# Patient Record
Sex: Female | Born: 1985 | Marital: Single
Health system: Southern US, Community
[De-identification: ages and names within clinical notes are randomized; demographics above are authoritative.]

---

## 2011-05-28 ENCOUNTER — Emergency Department: Payer: Self-pay | Admitting: Emergency Medicine

## 2011-06-20 ENCOUNTER — Emergency Department: Payer: Self-pay | Admitting: Unknown Physician Specialty

## 2011-08-08 ENCOUNTER — Encounter: Payer: Self-pay | Admitting: Maternal & Fetal Medicine

## 2011-08-08 LAB — TSH: Thyroid Stimulating Horm: 1.54 u[IU]/mL

## 2011-08-18 ENCOUNTER — Encounter: Payer: Self-pay | Admitting: Maternal and Fetal Medicine

## 2011-08-18 LAB — CBC WITH DIFFERENTIAL/PLATELET
Basophil #: 0 10*3/uL (ref 0.0–0.1)
Eosinophil #: 0.2 10*3/uL (ref 0.0–0.7)
HCT: 38.4 % (ref 35.0–47.0)
HGB: 13.1 g/dL (ref 12.0–16.0)
Lymphocyte %: 15.7 %
MCHC: 34 g/dL (ref 32.0–36.0)
MCV: 92 fL (ref 80–100)
Monocyte %: 4.7 %
Neutrophil #: 9.6 10*3/uL — ABNORMAL HIGH (ref 1.4–6.5)
RBC: 4.17 10*6/uL (ref 3.80–5.20)
RDW: 12.6 % (ref 11.5–14.5)

## 2011-08-18 LAB — APTT: Activated PTT: 38.2 secs — ABNORMAL HIGH (ref 23.6–35.9)

## 2011-08-18 LAB — PROTIME-INR: INR: 0.9

## 2011-08-25 ENCOUNTER — Encounter: Payer: Self-pay | Admitting: Obstetrics and Gynecology

## 2011-08-25 DIAGNOSIS — R55 Syncope and collapse: Secondary | ICD-10-CM

## 2011-08-25 LAB — CBC WITH DIFFERENTIAL/PLATELET
Basophil #: 0 10*3/uL (ref 0.0–0.1)
Basophil %: 0.2 %
Eosinophil #: 0.4 10*3/uL (ref 0.0–0.7)
Eosinophil %: 3 %
HGB: 13.4 g/dL (ref 12.0–16.0)
Lymphocyte #: 1.9 10*3/uL (ref 1.0–3.6)
Lymphocyte %: 13.1 %
MCH: 31 pg (ref 26.0–34.0)
MCHC: 34 g/dL (ref 32.0–36.0)
Monocyte #: 0.9 10*3/uL — ABNORMAL HIGH (ref 0.0–0.7)
Neutrophil %: 77.2 %
Platelet: 247 10*3/uL (ref 150–440)
RBC: 4.32 10*6/uL (ref 3.80–5.20)
RDW: 12.5 % (ref 11.5–14.5)

## 2011-08-25 LAB — BASIC METABOLIC PANEL
Anion Gap: 10 (ref 7–16)
BUN: 6 mg/dL — ABNORMAL LOW (ref 7–18)
Chloride: 99 mmol/L (ref 98–107)
Co2: 29 mmol/L (ref 21–32)
Creatinine: 0.6 mg/dL (ref 0.60–1.30)
Osmolality: 272 (ref 275–301)
Potassium: 3.9 mmol/L (ref 3.5–5.1)
Sodium: 138 mmol/L (ref 136–145)

## 2011-08-25 LAB — HEPATIC FUNCTION PANEL A (ARMC)
Albumin: 3.5 g/dL (ref 3.4–5.0)
Bilirubin,Total: 0.2 mg/dL (ref 0.2–1.0)
Total Protein: 7.6 g/dL (ref 6.4–8.2)

## 2011-09-17 ENCOUNTER — Emergency Department: Payer: Self-pay | Admitting: Emergency Medicine

## 2011-09-17 LAB — COMPREHENSIVE METABOLIC PANEL
Anion Gap: 11 (ref 7–16)
BUN: 7 mg/dL (ref 7–18)
Calcium, Total: 8.2 mg/dL — ABNORMAL LOW (ref 8.5–10.1)
Chloride: 101 mmol/L (ref 98–107)
Co2: 24 mmol/L (ref 21–32)
Creatinine: 0.57 mg/dL — ABNORMAL LOW (ref 0.60–1.30)
EGFR (African American): >60
EGFR (Non-African Amer.): >60
Glucose: 99 mg/dL (ref 65–99)
Osmolality: 270 (ref 275–301)
Potassium: 3.3 mmol/L — ABNORMAL LOW (ref 3.5–5.1)
SGPT (ALT): 17 U/L
Sodium: 136 mmol/L (ref 136–145)
Total Protein: 7.2 g/dL (ref 6.4–8.2)

## 2011-09-17 LAB — CBC
MCH: 30.7 pg (ref 26.0–34.0)
Platelet: 235 10*3/uL (ref 150–440)
RBC: 4.07 10*6/uL (ref 3.80–5.20)
RDW: 13 % (ref 11.5–14.5)
WBC: 20.4 10*3/uL — ABNORMAL HIGH (ref 3.6–11.0)

## 2011-10-17 ENCOUNTER — Encounter: Payer: Self-pay | Admitting: Obstetrics and Gynecology

## 2011-10-19 LAB — URINE CULTURE

## 2011-10-20 ENCOUNTER — Observation Stay: Payer: Self-pay | Admitting: Obstetrics and Gynecology

## 2011-10-20 LAB — URINALYSIS, COMPLETE
Bilirubin,UR: NEGATIVE
Blood: NEGATIVE
Ketone: NEGATIVE
Ph: 6 (ref 4.5–8.0)
RBC,UR: 3 /HPF (ref 0–5)
Squamous Epithelial: 15
WBC UR: 5 /HPF (ref 0–5)

## 2011-10-24 ENCOUNTER — Encounter: Payer: Self-pay | Admitting: Maternal & Fetal Medicine

## 2011-10-29 ENCOUNTER — Observation Stay: Payer: Self-pay

## 2011-10-29 LAB — URINALYSIS, COMPLETE
Blood: NEGATIVE
Protein: NEGATIVE
RBC,UR: 1 /HPF (ref 0–5)
Specific Gravity: 1.009 (ref 1.003–1.030)
WBC UR: 2 /HPF (ref 0–5)

## 2011-11-08 ENCOUNTER — Encounter: Payer: Self-pay | Admitting: Pediatrics

## 2011-11-14 ENCOUNTER — Encounter: Payer: Self-pay | Admitting: Obstetrics and Gynecology

## 2011-11-14 LAB — URINALYSIS, COMPLETE
Bilirubin,UR: NEGATIVE
Blood: NEGATIVE
Glucose,UR: NEGATIVE mg/dL (ref 0–75)
Ketone: NEGATIVE
RBC,UR: NONE SEEN /HPF (ref 0–5)
Specific Gravity: 1.004 (ref 1.003–1.030)
Squamous Epithelial: 3

## 2011-11-15 LAB — URINE CULTURE

## 2011-12-07 ENCOUNTER — Ambulatory Visit: Payer: Self-pay | Admitting: Obstetrics and Gynecology

## 2011-12-08 ENCOUNTER — Encounter: Payer: Self-pay | Admitting: Obstetrics and Gynecology

## 2011-12-08 LAB — CBC WITH DIFFERENTIAL/PLATELET
Basophil %: 0.3 %
Eosinophil %: 1.2 %
Lymphocyte #: 2.3 10*3/uL (ref 1.0–3.6)
Lymphocyte %: 11.7 %
MCH: 30.4 pg (ref 26.0–34.0)
MCHC: 32.8 g/dL (ref 32.0–36.0)
Monocyte #: 1.7 x10 3/mm — ABNORMAL HIGH (ref 0.2–0.9)
Monocyte %: 8.3 %
Neutrophil #: 15.6 10*3/uL — ABNORMAL HIGH (ref 1.4–6.5)
Platelet: 324 10*3/uL (ref 150–440)
RBC: 3.77 10*6/uL — ABNORMAL LOW (ref 3.80–5.20)
RDW: 14.6 % — ABNORMAL HIGH (ref 11.5–14.5)

## 2011-12-24 ENCOUNTER — Ambulatory Visit: Payer: Self-pay | Admitting: Obstetrics and Gynecology

## 2011-12-30 ENCOUNTER — Observation Stay: Payer: Self-pay

## 2011-12-30 LAB — URINALYSIS, COMPLETE
Bacteria: NONE SEEN
Bilirubin,UR: NEGATIVE
Bilirubin,UR: NEGATIVE
Blood: NEGATIVE
Glucose,UR: NEGATIVE mg/dL
Ketone: NEGATIVE
Ketone: NEGATIVE
Nitrite: NEGATIVE
Nitrite: NEGATIVE
Ph: 6
Ph: 7 (ref 4.5–8.0)
Protein: 30
Protein: NEGATIVE
RBC,UR: 18 /HPF
RBC,UR: NONE SEEN /HPF (ref 0–5)
Specific Gravity: 1.003
Squamous Epithelial: 20
Squamous Epithelial: <1
WBC UR: 9 /HPF
WBC UR: 1 /HPF (ref 0–5)

## 2011-12-30 LAB — CBC WITH DIFFERENTIAL/PLATELET
Basophil #: 0 10*3/uL
Basophil %: 0.1 %
Eosinophil #: 0.3 10*3/uL
Eosinophil %: 1.2 %
HCT: 34.4 % — ABNORMAL LOW
HGB: 11.4 g/dL — ABNORMAL LOW
Lymphocyte %: 10.7 %
Lymphs Abs: 2.3 10*3/uL
MCH: 30.7 pg
MCHC: 33.1 g/dL
MCV: 93 fL
Monocyte #: 1.6 10*3/uL — ABNORMAL HIGH
Monocyte %: 7.3 %
Neutrophil #: 17.3 10*3/uL — ABNORMAL HIGH
Neutrophil %: 80.7 %
Platelet: 394 10*3/uL
RBC: 3.71 X10 6/mm 3 — ABNORMAL LOW
RDW: 14.1 %
WBC: 21.4 10*3/uL — ABNORMAL HIGH

## 2011-12-30 LAB — COMPREHENSIVE METABOLIC PANEL WITH GFR
Albumin: 2.1 g/dL — ABNORMAL LOW
Alkaline Phosphatase: 148 U/L — ABNORMAL HIGH
Anion Gap: 9
BUN: 2 mg/dL — ABNORMAL LOW
Bilirubin,Total: 0.1 mg/dL — ABNORMAL LOW
Calcium, Total: 7.9 mg/dL — ABNORMAL LOW
Chloride: 109 mmol/L — ABNORMAL HIGH
Co2: 24 mmol/L
Creatinine: 0.55 mg/dL — ABNORMAL LOW
EGFR (African American): >60
EGFR (Non-African Amer.): >60
Glucose: 89 mg/dL
Osmolality: 279
Potassium: 3.4 mmol/L — ABNORMAL LOW
SGOT(AST): 19 U/L
SGPT (ALT): 16 U/L
Sodium: 142 mmol/L
Total Protein: 6.1 g/dL — ABNORMAL LOW

## 2011-12-30 LAB — OCCULT BLOOD X 1 CARD TO LAB, STOOL: Occult Blood, Feces: NEGATIVE

## 2011-12-30 LAB — LITHIUM LEVEL: Lithium: 0.42 mmol/L — ABNORMAL LOW

## 2011-12-31 LAB — HEMATOCRIT: HCT: 33.3 % — ABNORMAL LOW (ref 35.0–47.0)

## 2012-01-23 ENCOUNTER — Observation Stay: Payer: Self-pay

## 2012-01-23 LAB — COMPREHENSIVE METABOLIC PANEL
Albumin: 2.2 g/dL — ABNORMAL LOW (ref 3.4–5.0)
Alkaline Phosphatase: 201 U/L — ABNORMAL HIGH (ref 50–136)
Anion Gap: 14 (ref 7–16)
BUN: 5 mg/dL — ABNORMAL LOW (ref 7–18)
Bilirubin,Total: 0.9 mg/dL (ref 0.2–1.0)
Calcium, Total: 9 mg/dL (ref 8.5–10.1)
Co2: 22 mmol/L (ref 21–32)
EGFR (African American): >60
Glucose: 127 mg/dL — ABNORMAL HIGH (ref 65–99)
Osmolality: 271 (ref 275–301)
SGPT (ALT): 25 U/L
Sodium: 136 mmol/L (ref 136–145)

## 2012-01-23 LAB — URINALYSIS, COMPLETE
Glucose,UR: 50 mg/dL (ref 0–75)
Leukocyte Esterase: NEGATIVE
Nitrite: NEGATIVE
Ph: 5 (ref 4.5–8.0)
Protein: 100
RBC,UR: 3 /HPF (ref 0–5)
Specific Gravity: 1.029 (ref 1.003–1.030)
Squamous Epithelial: 6
Transitional Epi: <1

## 2012-01-23 LAB — CBC WITH DIFFERENTIAL/PLATELET
Basophil #: 0.1 10*3/uL (ref 0.0–0.1)
Eosinophil #: 0.1 10*3/uL (ref 0.0–0.7)
HCT: 37.2 % (ref 35.0–47.0)
Lymphocyte %: 10.8 %
MCHC: 33.3 g/dL (ref 32.0–36.0)
MCV: 92 fL (ref 80–100)
Monocyte #: 1.8 x10 3/mm — ABNORMAL HIGH (ref 0.2–0.9)
Monocyte %: 9.9 %
Neutrophil #: 14.1 10*3/uL — ABNORMAL HIGH (ref 1.4–6.5)
Neutrophil %: 78.2 %
Platelet: 374 10*3/uL (ref 150–440)
RDW: 14.1 % (ref 11.5–14.5)

## 2012-01-30 ENCOUNTER — Encounter: Payer: Self-pay | Admitting: Obstetrics and Gynecology

## 2012-02-01 LAB — BETA STREP CULTURE(ARMC)

## 2012-05-06 ENCOUNTER — Emergency Department: Payer: Self-pay | Admitting: Emergency Medicine

## 2012-05-06 LAB — LITHIUM LEVEL: Lithium: 1.2 mmol/L

## 2012-05-06 LAB — CBC
HCT: 41 % (ref 35.0–47.0)
HGB: 13.7 g/dL (ref 12.0–16.0)
MCHC: 33.5 g/dL (ref 32.0–36.0)
MCV: 89 fL (ref 80–100)
Platelet: 320 10*3/uL (ref 150–440)
RDW: 13.8 % (ref 11.5–14.5)
WBC: 11.4 10*3/uL — ABNORMAL HIGH (ref 3.6–11.0)

## 2012-05-06 LAB — BASIC METABOLIC PANEL
Calcium, Total: 8.9 mg/dL (ref 8.5–10.1)
Co2: 26 mmol/L (ref 21–32)
EGFR (African American): >60
EGFR (Non-African Amer.): >60
Glucose: 94 mg/dL (ref 65–99)
Osmolality: 276 (ref 275–301)
Potassium: 2.9 mmol/L — ABNORMAL LOW (ref 3.5–5.1)

## 2012-05-06 LAB — URINALYSIS, COMPLETE
Bacteria: NONE SEEN
Bilirubin,UR: NEGATIVE
Blood: NEGATIVE
Ketone: NEGATIVE
Nitrite: NEGATIVE
Ph: 6 (ref 4.5–8.0)
Protein: NEGATIVE
WBC UR: 2 /HPF (ref 0–5)

## 2012-05-06 LAB — TSH: Thyroid Stimulating Horm: 1.62 u[IU]/mL

## 2012-05-06 LAB — T4, FREE: Free Thyroxine: 0.89 ng/dL (ref 0.76–1.46)

## 2012-10-14 IMAGING — US US OB FOLLOW-UP - NRPT [facility]
1 series · 14 of 28 positions shown · non-contrast
Comparison: none

[Series 1: us ob follow-up - nrpt [facility] · 0.26mm/px · 14 of 57 slices shown]
[im 3/57]
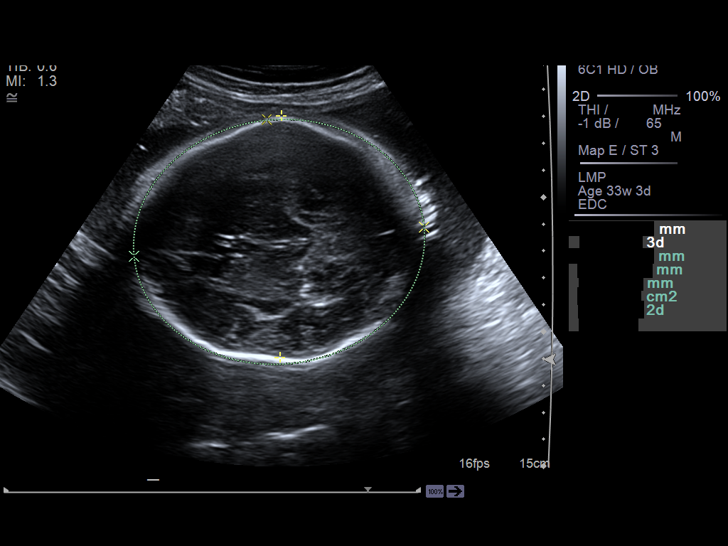
[im 7/57]
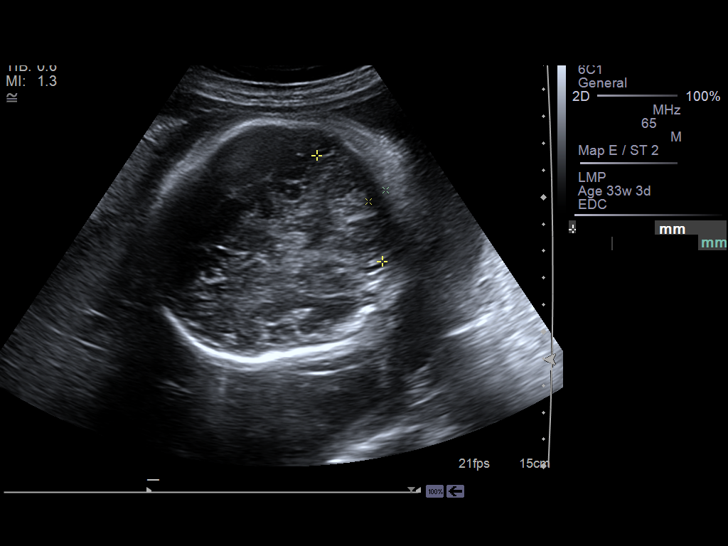
[im 11/57]
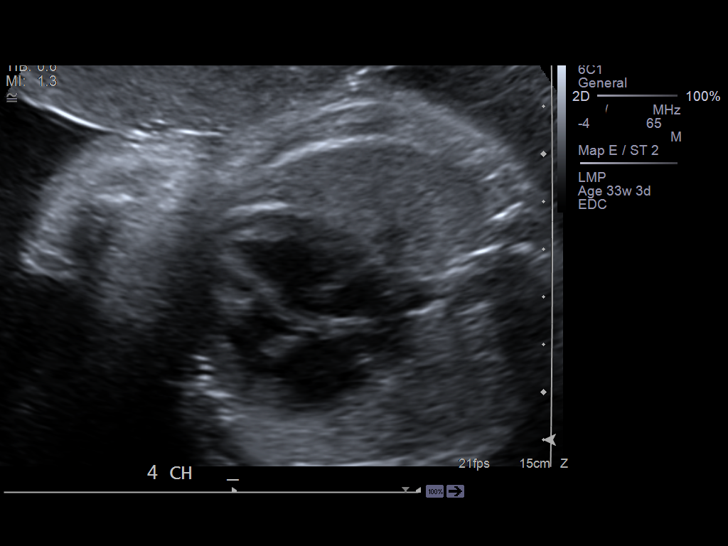
[im 15/57]
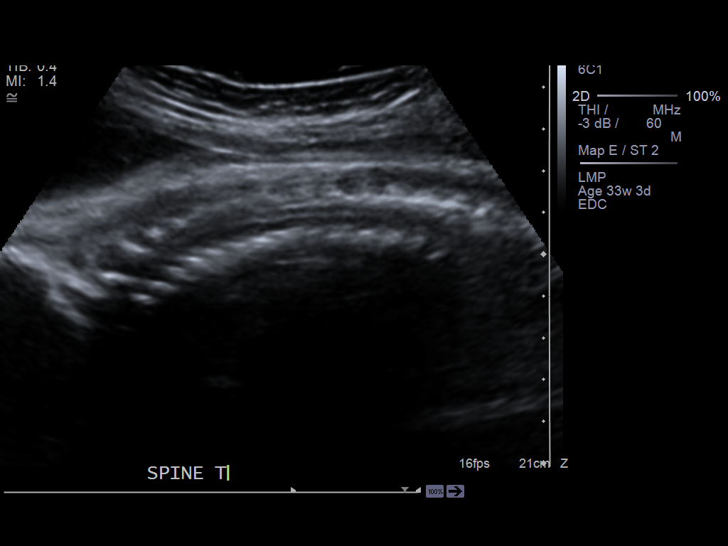
[im 19/57]
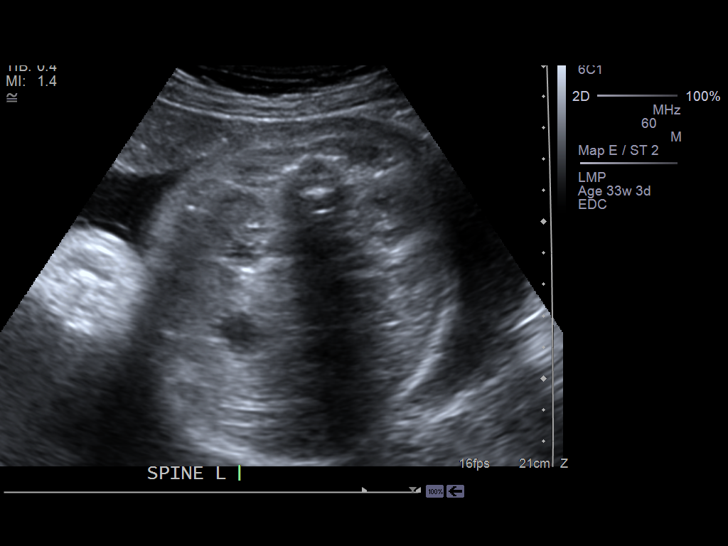
[im 23/57]
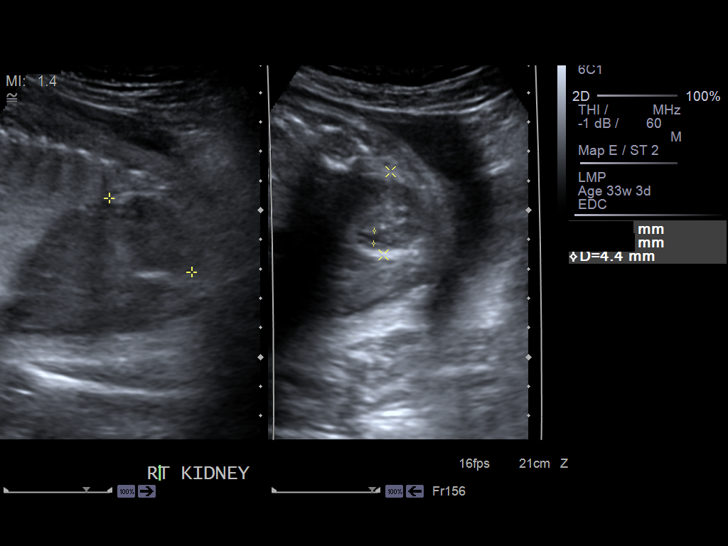
[im 27/57]
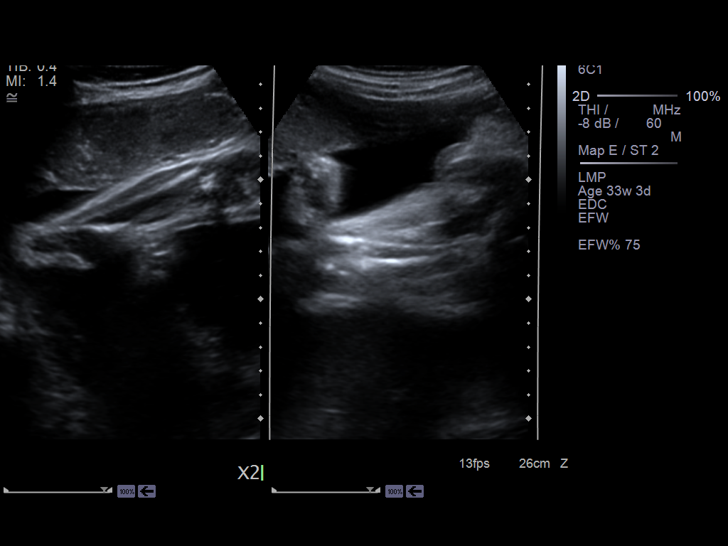
[im 32/57]
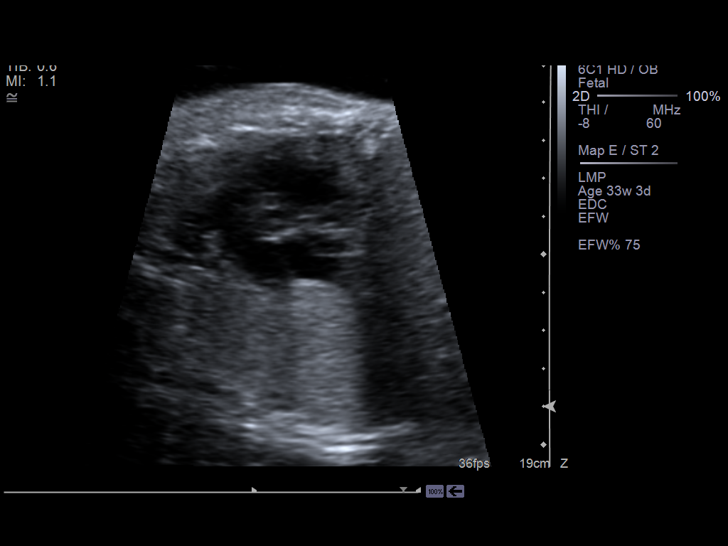
[im 36/57]
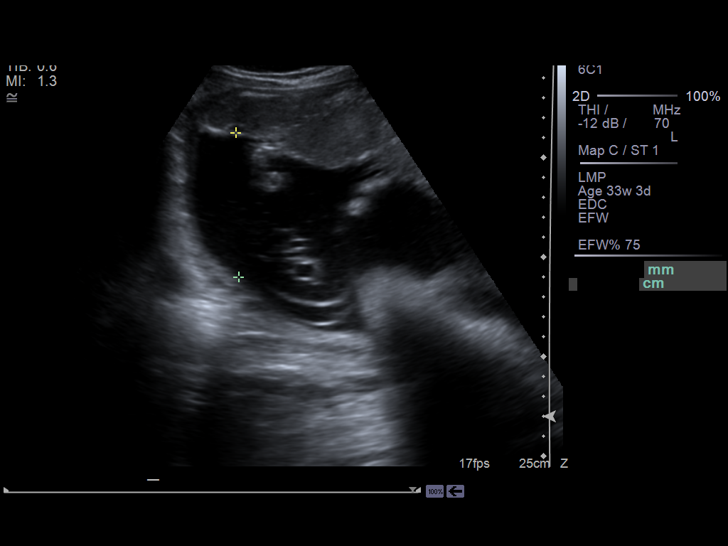
[im 40/57]
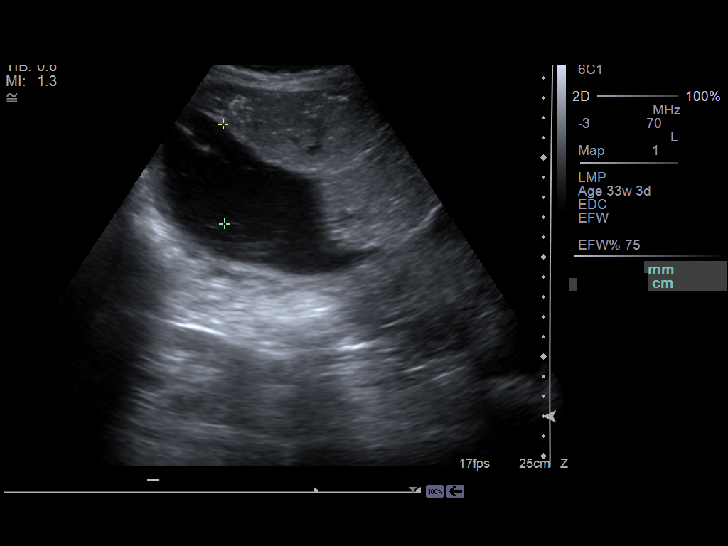
[im 44/57]
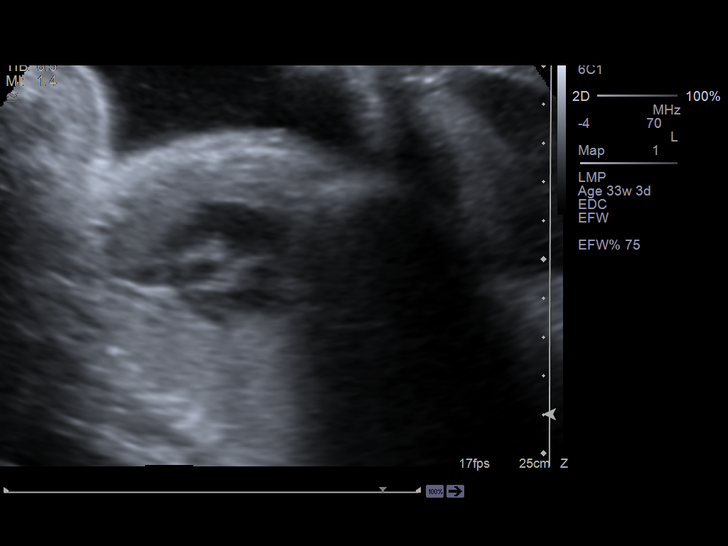
[im 48/57]
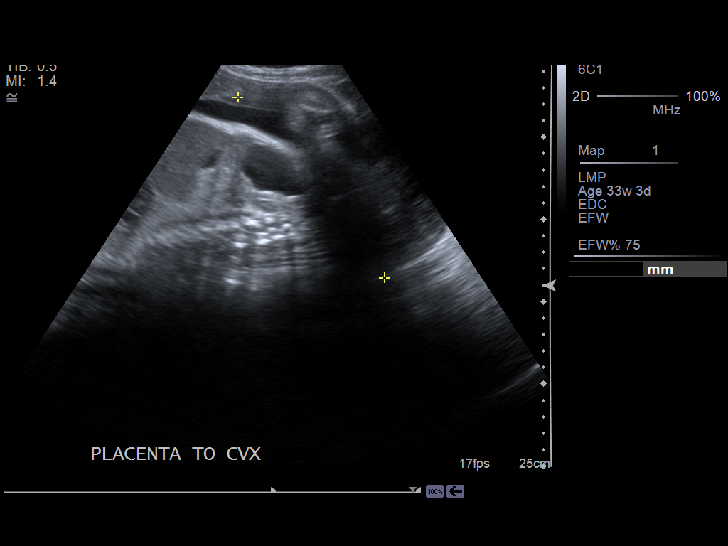
[im 52/57]
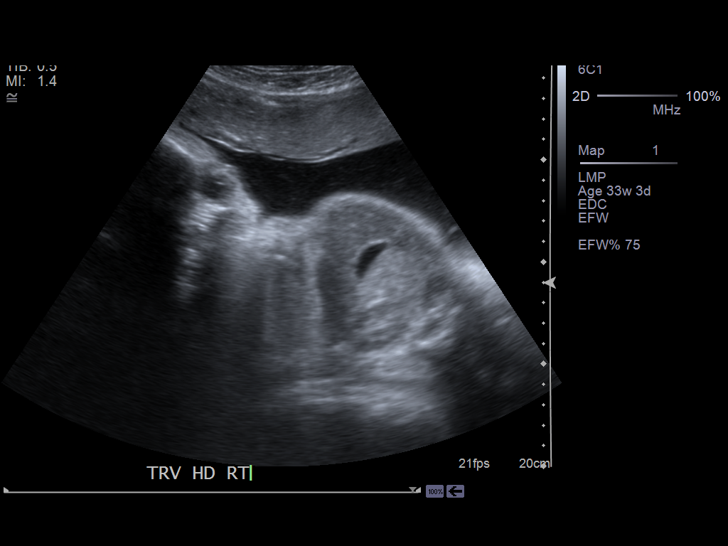
[im 57/57]
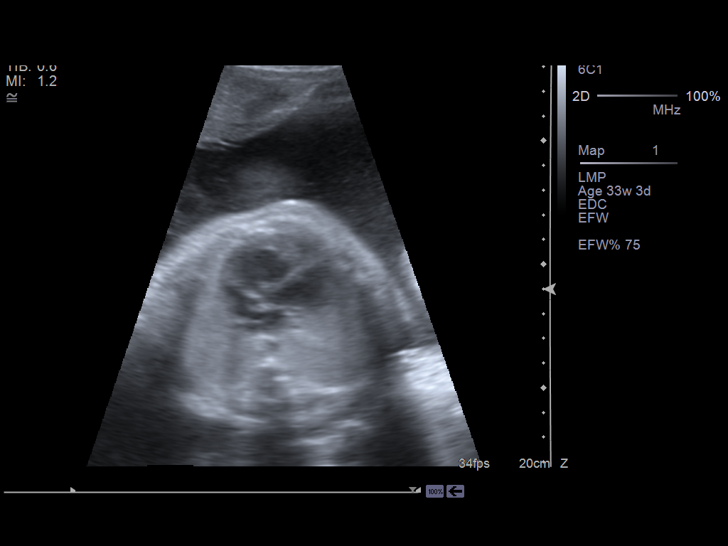

[14 of 28 positions shown; findings below may reference images not displayed]

IMAGES IMPORTED FROM THE SYNGO WORKFLOW SYSTEM
NO DICTATION FOR STUDY

## 2012-11-04 IMAGING — US US OB FOLLOW-UP - NRPT [facility]
1 series · 14 of 28 positions shown · non-contrast
Comparison: none

[Series 1: us ob follow-up - nrpt [facility] · 0.30mm/px · 14 of 49 slices shown]
[im 2/49]
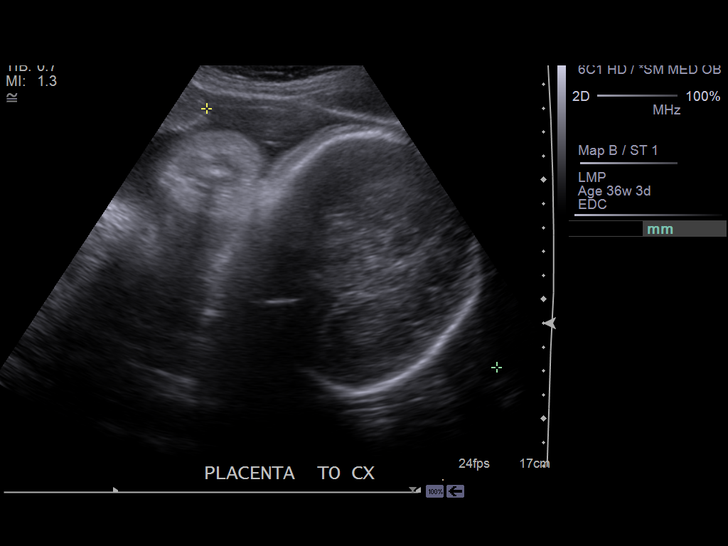
[im 6/49]
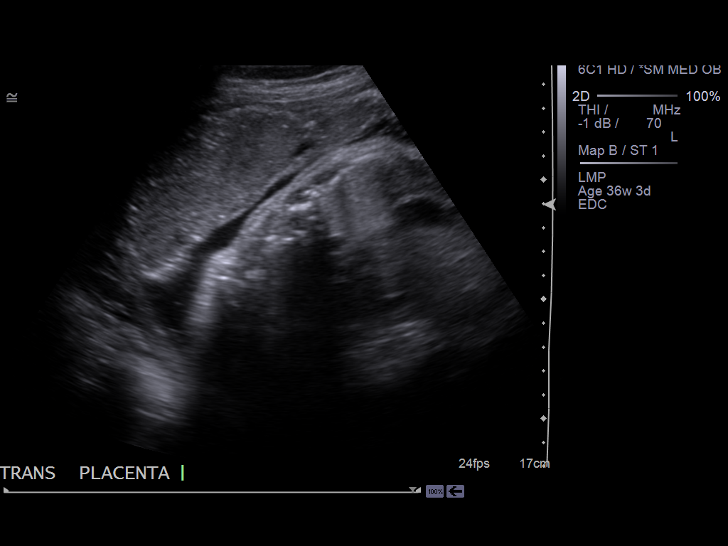
[im 9/49]
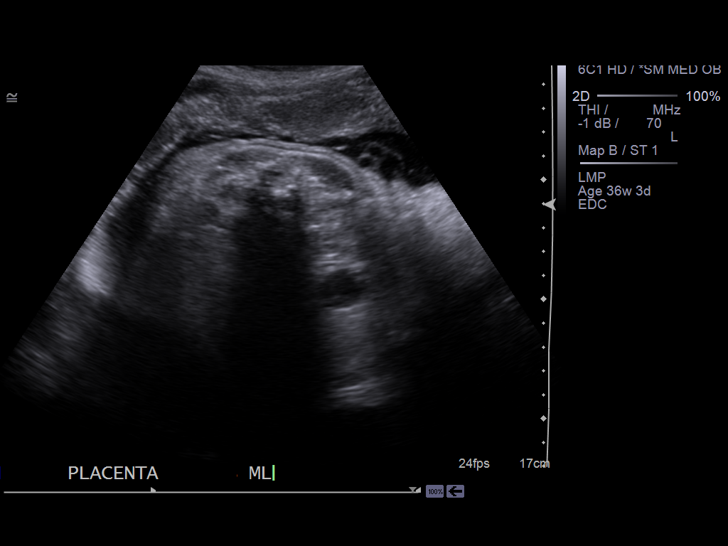
[im 13/49]
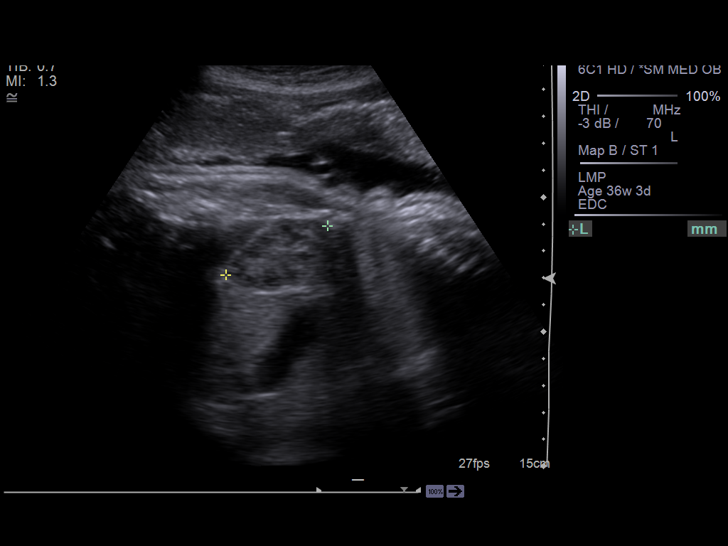
[im 17/49]
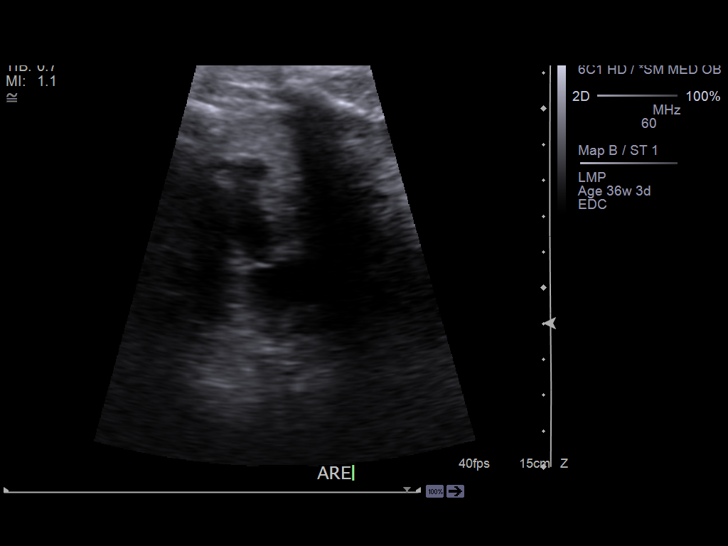
[im 20/49]
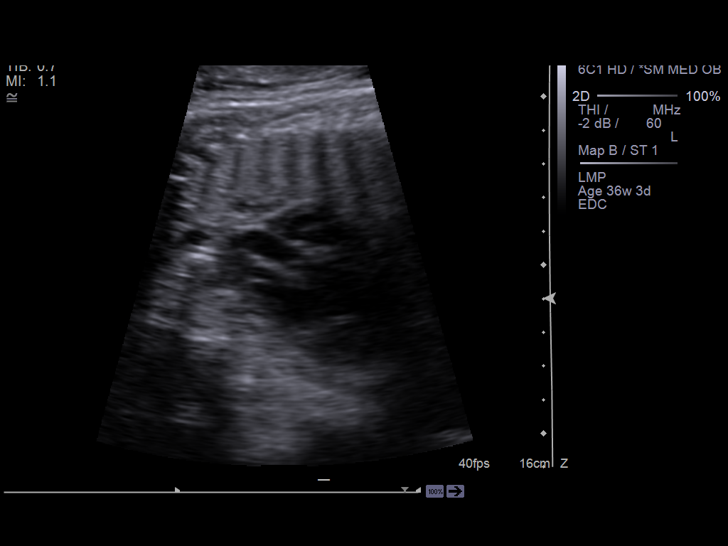
[im 24/49]
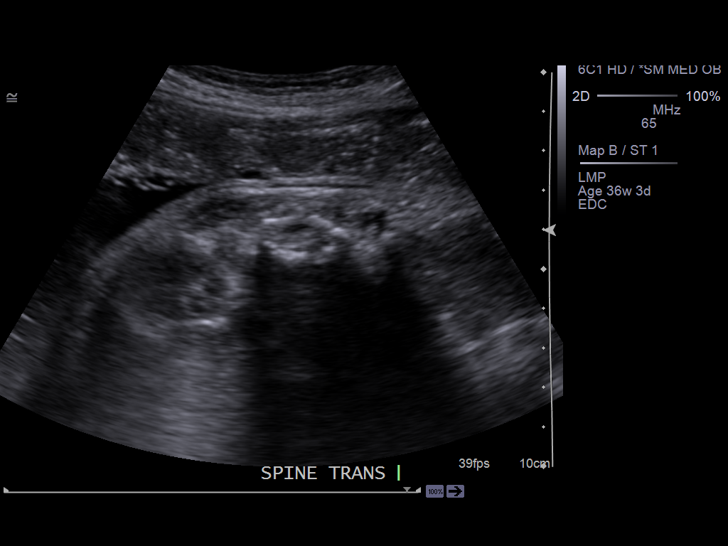
[im 27/49]
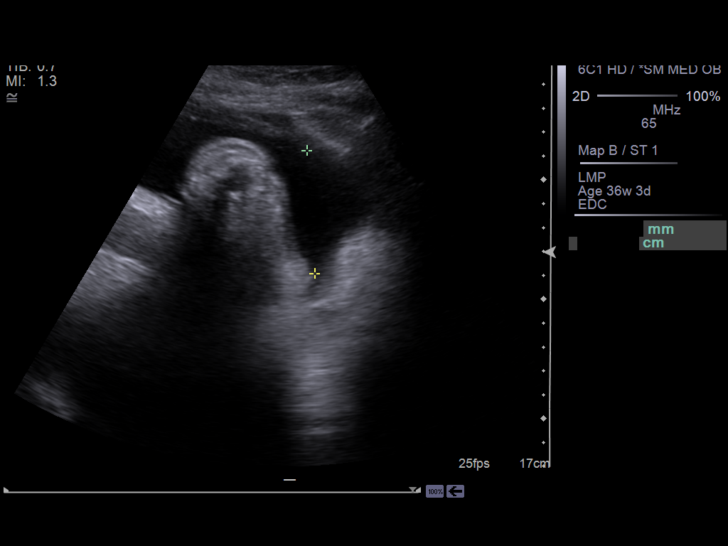
[im 31/49]
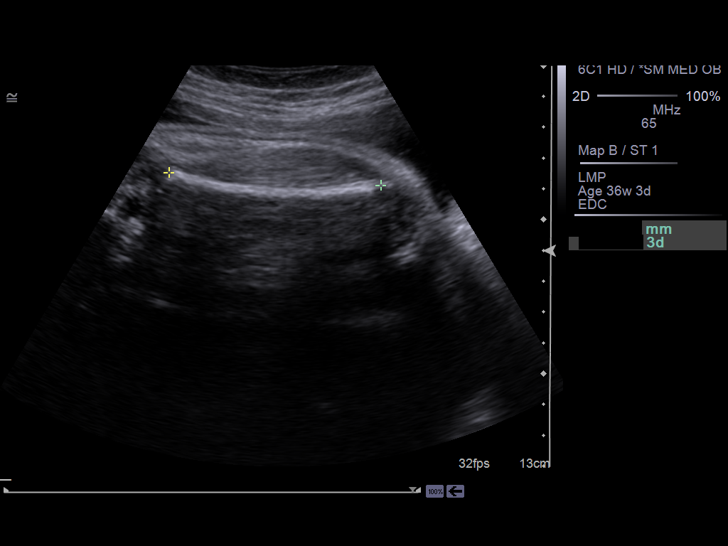
[im 34/49]
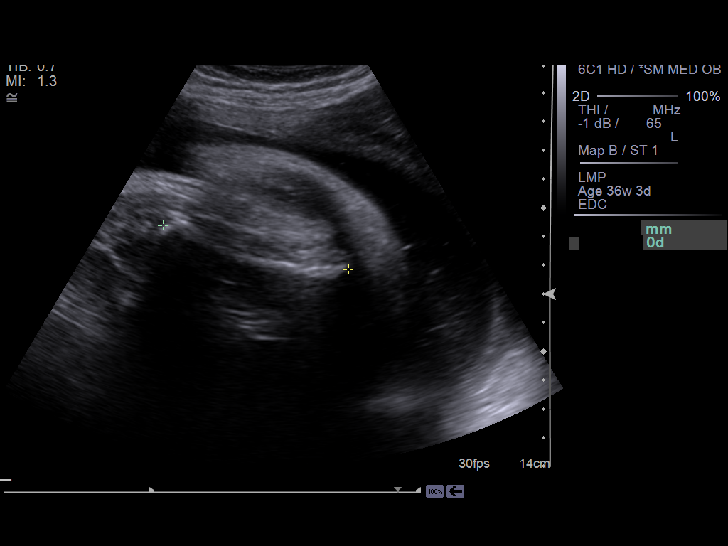
[im 38/49]
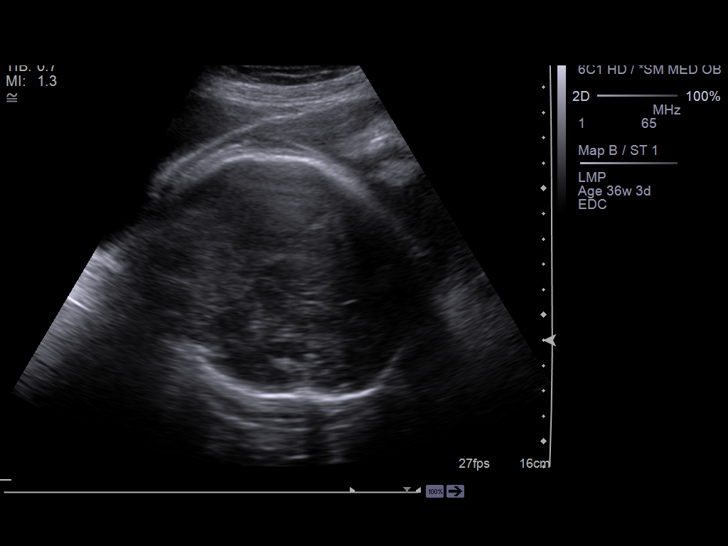
[im 41/49]
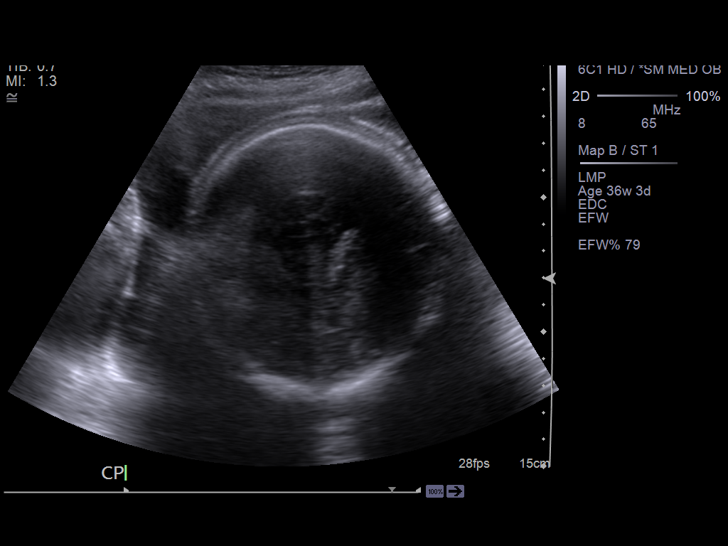
[im 45/49]
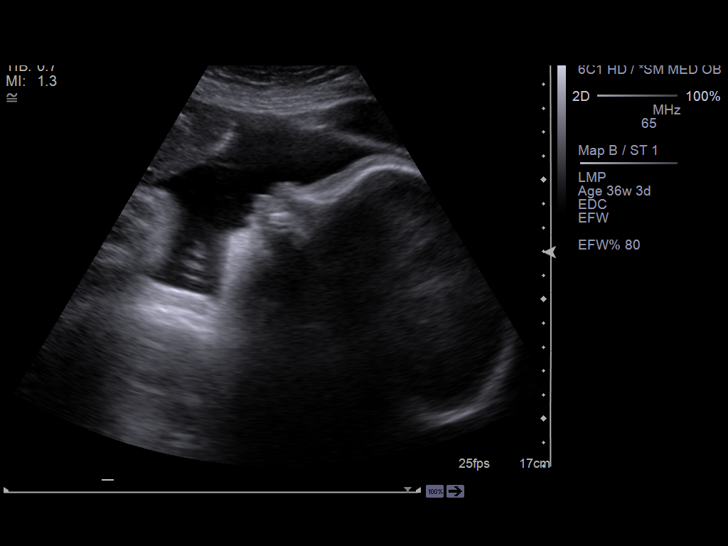
[im 49/49]
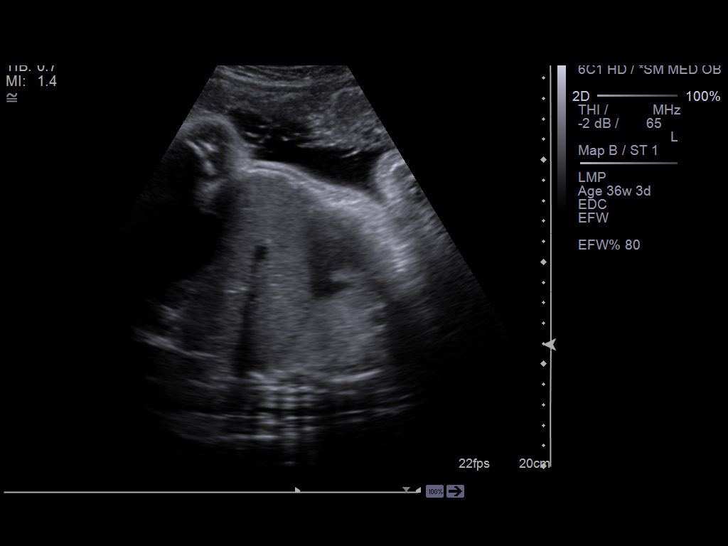

[14 of 28 positions shown; findings below may reference images not displayed]

IMAGES IMPORTED FROM THE SYNGO WORKFLOW SYSTEM
NO DICTATION FOR STUDY

## 2012-11-04 IMAGING — US US EXTREM UP VENOUS*R*
1 series · 14 of 24 positions shown · non-contrast
Comparison: none

REASON FOR EXAM: STAT CR DR Keya [phone] or her pager is [REDACTED] pain and swelling...
COMMENTS:

PROCEDURE:     US  - US DOPPLER UP EXTR RIGHT  - January 30, 2012  [DATE]
RESULT:     The deep venous system of the right upper extremity was
interrogated with Doppler ultrasound. The waveform patterns and color flow
images are normal. The accessible veins were normally compressible.

[Series 1: us extrem up venous*right* · 0.05mm/px · 14 of 32 slices shown]
[im 1/32]
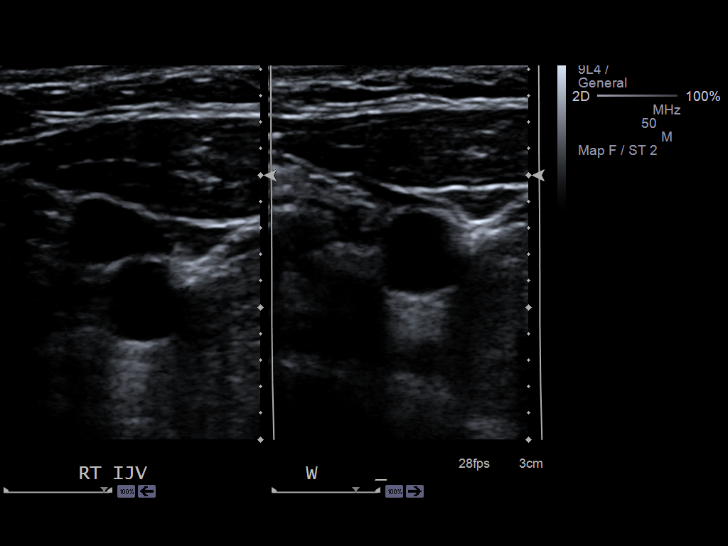
[im 3/32]
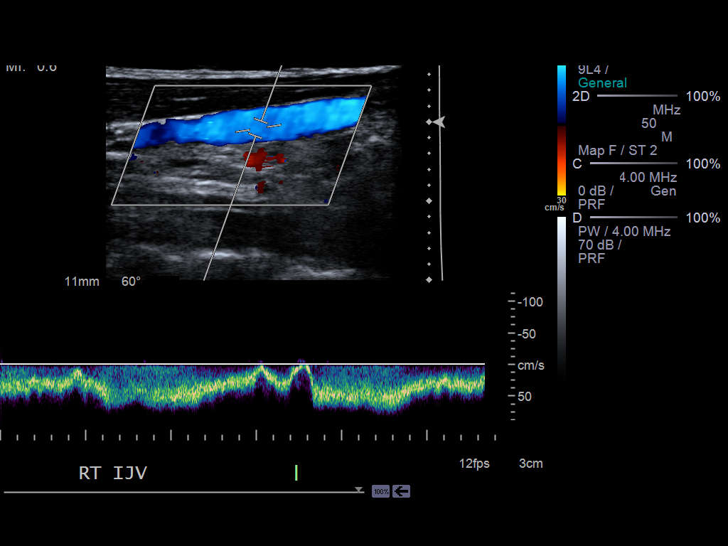
[im 6/32]
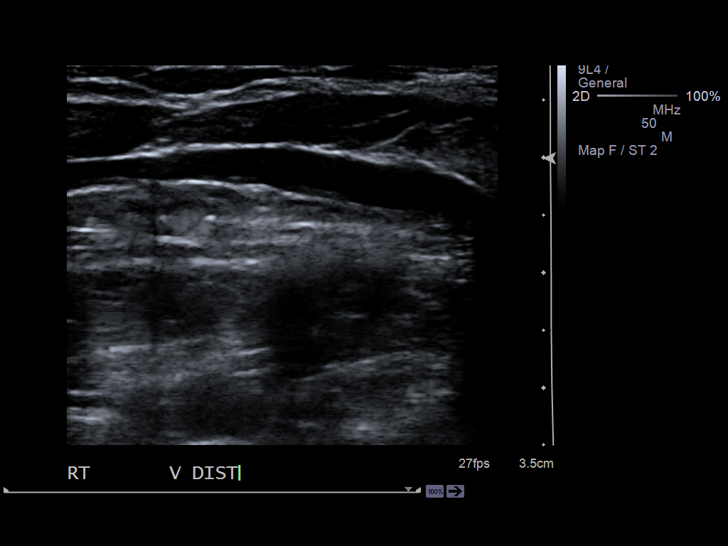
[im 9/32]
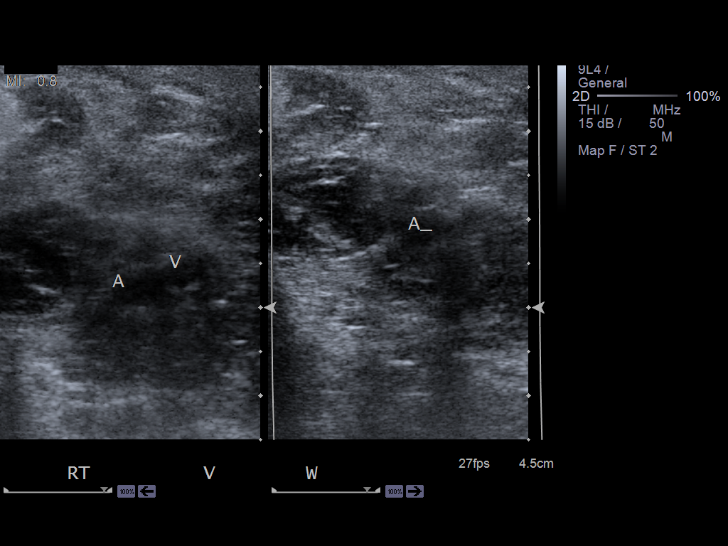
[im 10/32]
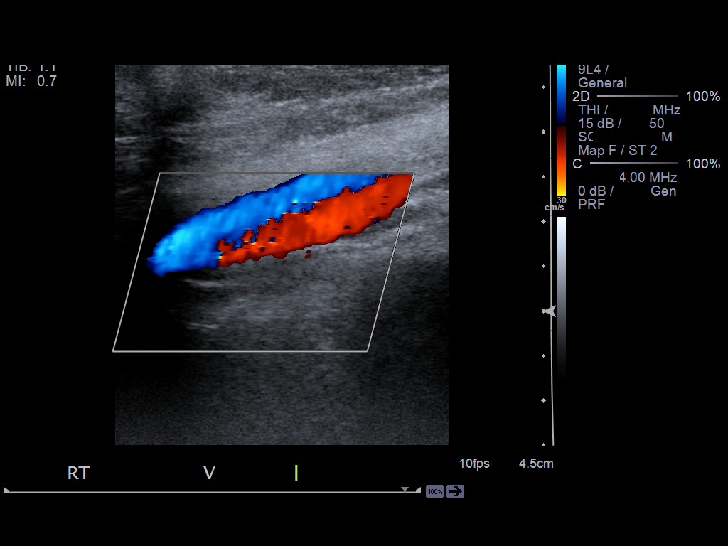
[im 13/32]
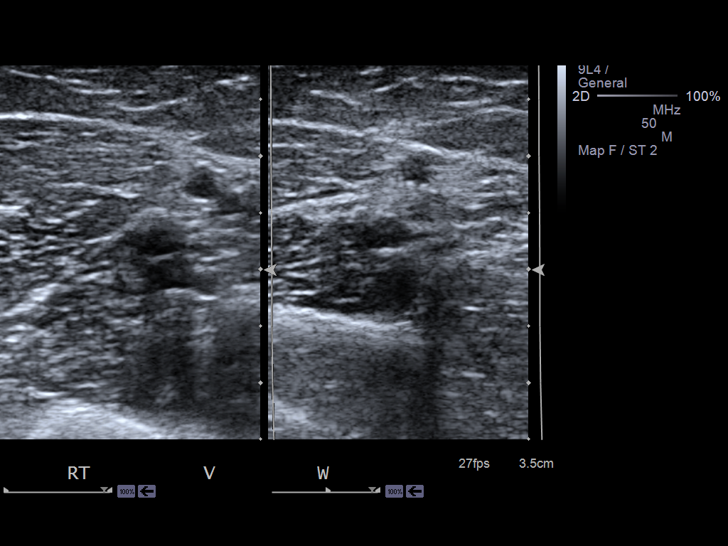
[im 15/32]
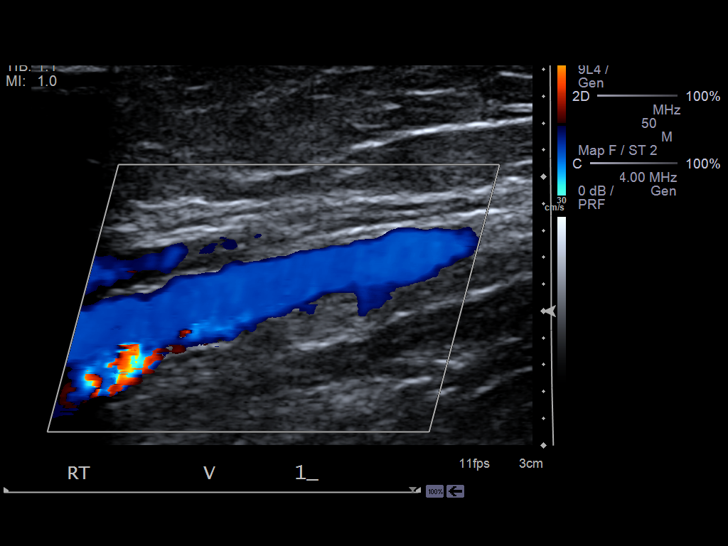
[im 17/32]
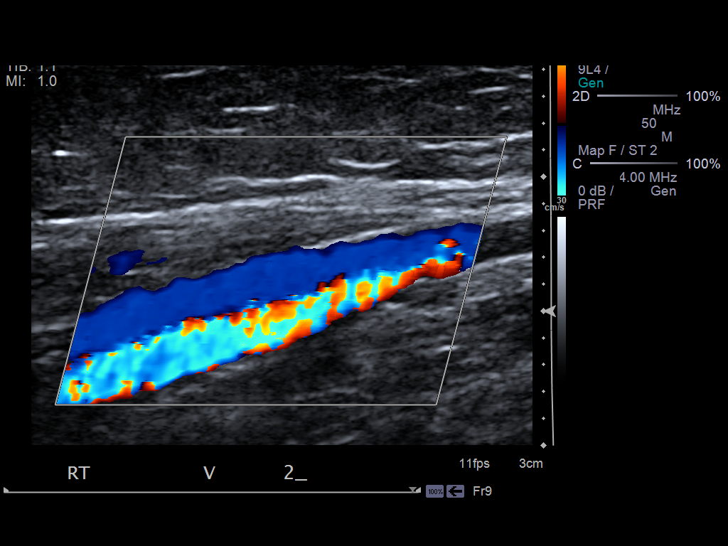
[im 19/32]
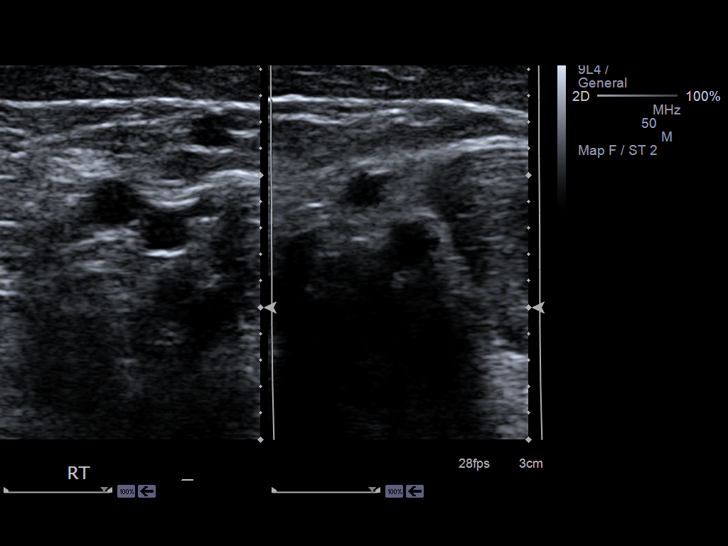
[im 22/32]
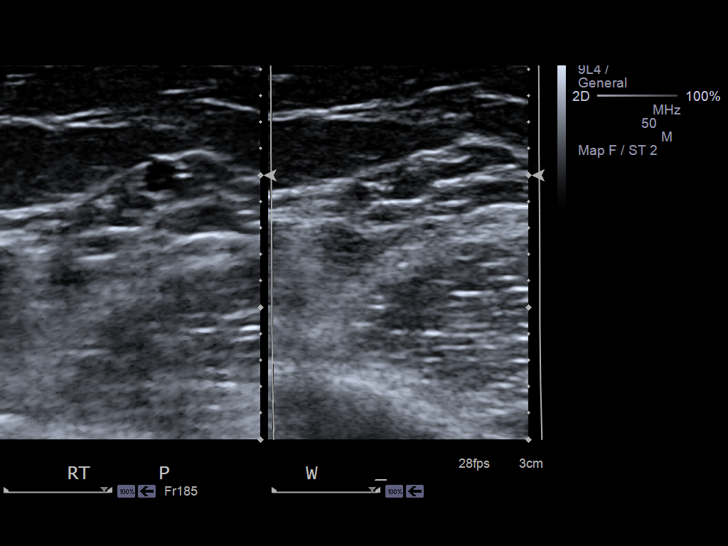
[im 25/32]
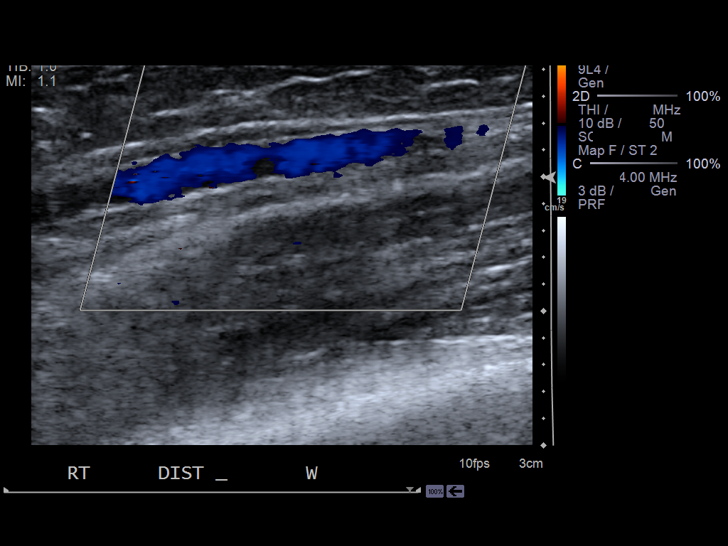
[im 26/32]
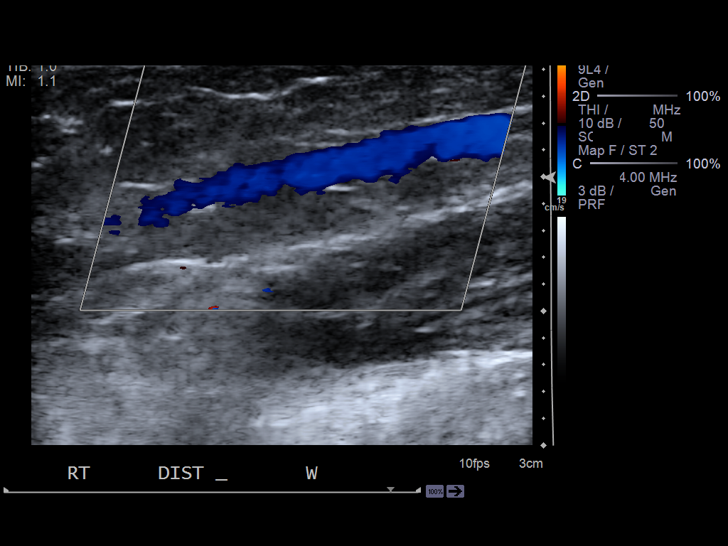
[im 29/32]
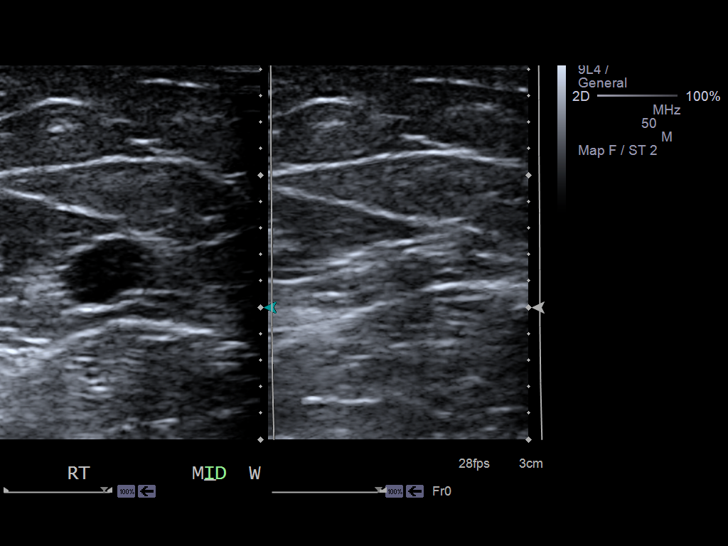
[im 32/32]
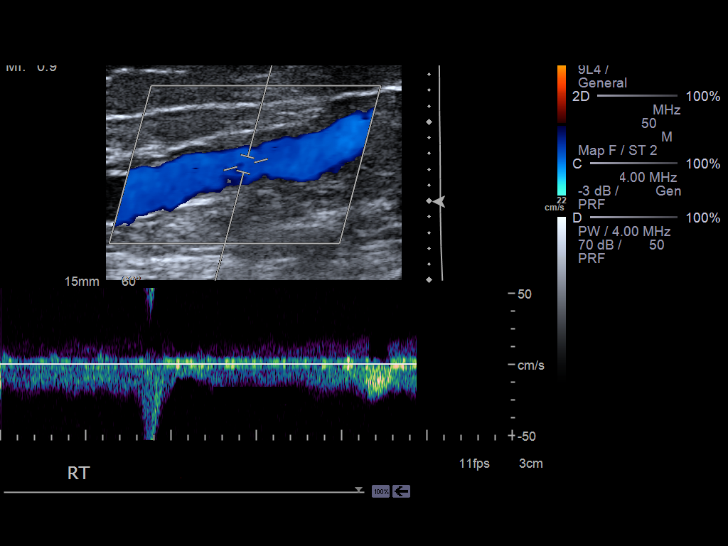

[14 of 24 positions shown; findings below may reference images not displayed]

IMPRESSION: There is no evidence of deep venous thrombosis in the right
upper extremity from the antecubital veins to the jugular vein.

## 2012-12-02 ENCOUNTER — Emergency Department: Payer: Self-pay | Admitting: Emergency Medicine

## 2013-01-13 ENCOUNTER — Emergency Department: Payer: Self-pay | Admitting: Emergency Medicine

## 2013-01-13 LAB — COMPREHENSIVE METABOLIC PANEL
Albumin: 4.2 g/dL (ref 3.4–5.0)
Bilirubin,Total: 0.3 mg/dL (ref 0.2–1.0)
Chloride: 107 mmol/L (ref 98–107)
Co2: 29 mmol/L (ref 21–32)
Creatinine: 0.84 mg/dL (ref 0.60–1.30)
EGFR (African American): >60
Osmolality: 280 (ref 275–301)
Potassium: 3.7 mmol/L (ref 3.5–5.1)
SGOT(AST): 26 U/L (ref 15–37)
SGPT (ALT): 45 U/L (ref 12–78)
Sodium: 142 mmol/L (ref 136–145)
Total Protein: 8.2 g/dL (ref 6.4–8.2)

## 2013-01-13 LAB — CBC
HCT: 42.6 % (ref 35.0–47.0)
HGB: 14.2 g/dL (ref 12.0–16.0)
MCH: 29.1 pg (ref 26.0–34.0)
MCHC: 33.5 g/dL (ref 32.0–36.0)
RBC: 4.89 10*6/uL (ref 3.80–5.20)
RDW: 14.1 % (ref 11.5–14.5)
WBC: 12.7 10*3/uL — ABNORMAL HIGH (ref 3.6–11.0)

## 2013-03-28 ENCOUNTER — Other Ambulatory Visit: Payer: Self-pay | Admitting: Urgent Care

## 2013-03-28 LAB — CBC WITH DIFFERENTIAL/PLATELET
Basophil #: 0.1 10*3/uL (ref 0.0–0.1)
Basophil %: 1.1 %
Eosinophil #: 0.4 10*3/uL (ref 0.0–0.7)
Eosinophil %: 6.2 %
HGB: 14.4 g/dL (ref 12.0–16.0)
Lymphocyte #: 1.4 10*3/uL (ref 1.0–3.6)
MCV: 86 fL (ref 80–100)
Monocyte #: 0.8 x10 3/mm (ref 0.2–0.9)
Monocyte %: 12.8 %
Neutrophil #: 3.7 10*3/uL (ref 1.4–6.5)
Neutrophil %: 57.9 %
RBC: 4.82 10*6/uL (ref 3.80–5.20)
RDW: 13.3 % (ref 11.5–14.5)

## 2013-03-28 LAB — COMPREHENSIVE METABOLIC PANEL
Albumin: 3.5 g/dL (ref 3.4–5.0)
Anion Gap: 2 — ABNORMAL LOW (ref 7–16)
BUN: 4 mg/dL — ABNORMAL LOW (ref 7–18)
Bilirubin,Total: 0.1 mg/dL — ABNORMAL LOW (ref 0.2–1.0)
Calcium, Total: 8.7 mg/dL (ref 8.5–10.1)
Co2: 26 mmol/L (ref 21–32)
EGFR (African American): >60
Glucose: 119 mg/dL — ABNORMAL HIGH (ref 65–99)
SGOT(AST): 24 U/L (ref 15–37)
SGPT (ALT): 27 U/L (ref 12–78)
Total Protein: 7.4 g/dL (ref 6.4–8.2)

## 2013-04-02 ENCOUNTER — Ambulatory Visit: Payer: Self-pay | Admitting: Urgent Care

## 2013-04-25 ENCOUNTER — Ambulatory Visit: Payer: Self-pay | Admitting: Gastroenterology

## 2013-05-19 ENCOUNTER — Emergency Department: Payer: Self-pay | Admitting: Internal Medicine

## 2013-06-13 ENCOUNTER — Encounter: Payer: Self-pay | Admitting: Obstetrics and Gynecology

## 2013-06-14 ENCOUNTER — Encounter: Payer: Self-pay | Admitting: Maternal and Fetal Medicine

## 2014-11-14 NOTE — Consult Note (Signed)
Referral Information:  Reason for Referral 29 yo G4P1021 at approximately [redacted] weeks gestation. Of note during her pregnancy in 2013, the patient had consultations with [place].  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 She had removal of first ribs bilaterally [facility]) and portions of her scalene muscles.  Then thrombolyisis was attempted but unsuccessful due to hardening of placque.  She reports normal thrombophilia testing therapeutic lovenox x 14 months initially  She also reports use of Plavix for approximately 8 months.  SHE reports residual numbness intermittently and occasional loss of sensation in right hand.  With her pregnancy last year, the patient was on prophylactic doses of Enoxaparin (30 mg twice daily until 28 weeks; 40 mg twice daily from 28-36 weeks; UFH 7500 twice daily for the remainder of her pregnancy. During this pregnancy, the patient had a negative thrombophilia work up.   That pregnancy also complicated with GDM   Referring Physician [facility]   Past Obstetrical Hx 2005 pregnancy loss (8weeks) 2012 pregnancy loss (12-13 weeks), D&E--was on lovenox daily during that pregnancy 2013 as described above - Also during the pregnancy, the patient developed gestational diabetes   Home Medications: Medication Instructions Status  lithium 300 mg oral capsule 1 cap(s) orally 3 times a day Active  Neurontin 800 mg oral tablet 1 tab(s) orally 3 times a day Active  clonazePAM 2 mg oral tablet  orally 2 times a day Active   Allergies:   Penicillin: Anaphylaxis  Ceftin: Anaphylaxis  Biaxin: Anaphylaxis  Iodine: Anaphylaxis  Shellfish: Anaphylaxis  Latex: Unknown  Vital Signs/Notes:  Nursing Vital Signs: **Vital Signs.:   17-Nov-14 13:52  Vital Signs Type Routine; Perinatal Clinic  Temperature Temperature (F) 97.3  Celsius 36.2  Pulse Pulse 93  Respirations Respirations 18   Systolic BP Systolic BP 329  Diastolic BP (mmHg) Diastolic BP (mmHg) 66  Mean BP 81  Pulse Ox % Pulse Ox % 100  Oxygen Delivery Room Air/ 21 %   Perinatal Consult:  PMed Hx Rubella Immune   Past Medical History cont'd 1. Bipolar Disorder   PSurg Hx laparoscopic ovarian cystectomy x 3 (2000, 2005, 2007),  bilateral first rib resection due to thoracic outlet syndrome (2001)   FHx birth defects on either side, Her mother had bipolar and committed suicide   Occupation Mother unemployed   Soc Hx tobacco, decreased from 1ppd to 3 cig/daily   Impression/Recommendations:  Impression 29 yo G4P1021 at approximately 6 weeks Hiistory of right upper extremity DVT in the setting of thoracic outlet syndrome  (s/p removal of first ribs bilaterally).  This syndrome is generally associated wtih compression of the subclavian vein from first rib abnormalities. Surgical removal of the ribs or restricting lesions (e.g. musculofascial bands/hypertrophied muscles) reduces the recurrence risk. We addressed the hypercoaguable state of pregnancy and recommendations for prophylactic anticoagulation during pregnancy secondary to her potential risk for recurrence.We addressed recommendations for pregnancy surveillance (outlined below)  2. Bipolar disorder--she needs to resume medications.  We addressed the safety profile of her previous regimen (neurontin, thorazine, lithium).  She previously met with our genetic counselor regarding risks of teratology.  Please see that letter for details. I would recommend fetal echocardiogram at 20-[redacted] weeks gestation due to Lithium expsoure and the association with congenital heart defects.  She is planning to meet a new psychiatrist in the coming weeks and wants to resume medications after meeting with her new provider.  We  also addressed her increased risk for pp depression and need for  close follow up/support in the ppartum period   Recommendations 1. We have scheduled the  patient for a viability ultrasound to be done in 3 days. 2. Assuming pregnancy viable:   a) Enoxaparin 41m daily until 28 weeks;   b) Enoxaprain 40 mg daily at 28 weeks   c) Convert to standard Heparin (7500u bid) at [redacted] weeks gestation. 3.  During labor she should have sequential compresion stockings in place.  4. Post partum she can resume Enoxaparin 12 hours after a vaginal delivery or 24 hours after cesarean section (in the event no contraindication such as pospartum hemorrhage) 5.  I would recommend continuation of anticoagulation for 6 weeks postpartum.  6.  Early gestational diabetes screening 7. I recommend pregnancy surveillance with detailed anatomic survey, fetal echocardiogram (20-[redacted] weeks gestation--due to lithium exposure and father of baby with ?congenital heart defect), and serial growth assessments as outlined below   Plan:  Genetic Counseling yes   Prenatal Diagnosis Options Level II [place] Fetal echocardiogram at 20-[redacted] weeks gestation   Ultrasound at what gestational ages Monthly > 28 weeks   Antepartum Testing Weekly, Starting at 32to 36 weeks   Additional Testing Thrombophilia   Delivery at what gestational age 31-40 weeks   Total Time Spent with Patient 30 minutes   >50% of visit spent in couseling/coordination of care yes   Office Use Only 99242  Level 2 (366m) NEW office consult exp prob focused   Coding Description: OTHER: Thoracic Outlet Syndrome.  Electronic Signatures: BrSande RivesMD)  (Signed [id number])  Authored: Referral, Home Medications, Allergies, Vital Signs/Notes, Consult, Impression, Plan, Billing, Coding Description   Last Updated: 17-Nov-14 15:56 by BrSande RivesMD)

## 2014-11-16 NOTE — Consult Note (Signed)
Referral Information:   Reason for Referral 29 yo G3P0020 LMP 05/20/2011 and EDD 02/24/2012 at 11 weeks 3 days by LMP c/w ultrasound performed on 08/05/11.  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 She had removal of first ribs bilaterally([facility]) and portions of her scalene muscles.  Thrombolyisis was attempted but unsuccessful due to hardening of placque.  She reports normal thrombophilia testing therapeutic lovenox x 14 months.  She also reports use of Plavix for approximately 8 months.  SHE reports residual numbness intermittently and occasional loss of sensation in right hand.    Referring Physician [facility]    Past Obstetrical Hx 2005 pregnancy loss (8weeks) 2012 pregnancy loss (12-13 weeks), D&E--was on lovenox daily during that pregnancy   Home Medications:  Prenatal 1: 1 cap(s) po once a day x 100 days, Active  ibuprofen 800 mg tablet: 1 tab(s) orally 3 times a day x 7 days- as needed , Active  Pyridium 100 mg tablet: 1 tab(s) orally 3 times a day x 3 days, Active  Bactrim DS 800 mg-160 mg tablet: 1 tab(s) orally 2 times a day x 3 days, Active  clonazepam 0.5 mg oral tablet: 0.5  orally 4 times a day, Active  lithium carbonate : 1   3 times a day, Active  Neurontin 800 mg oral tablet: 1 tab(s) orally 3 times a day, Active  Thorazine : 1   once a day (at bedtime), Active  Naprosyn 500 mg oral tablet: 1 tab(s) orally 2 times a day, Active  Tylenol as needed:   , Active  Allergies:   Penicillin: Unknown  Ceftin: Unknown  Biaxin: Unknown  Iodine: Unknown  Shellfish: Unknown  Perinatal Consult:   PMed Hx Rubella Immune    Past Medical History cont'd 1. Bipolar Disorder--she is currently NOT taking clonzaepam (never took during pregnancy and does not like side effects of this med) She has not taken Litium, Neurontin, or Throazine (stopped these medications at [redacted] weeks gestation  due to concern that they would harm pregnancy) She reports worsening depressive symptoms but has not suicidal ideation 2. Dental Caries--she was taking NSAIDS for pain relief.    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  This syndrome is generally associated wtih compression of the subclavian vein from first rib abnormalities. Surgical removal of the ribs or restricting lesions (e.g. musculofascial bands/hypertrophied muscles) reduces the recurrence risk.  Thrombophilia assessment is considered controversial, however, in the context of pregnancy we would alter her anticoagulation (therapeutic rather than prophylactic) if a significant thromboplia is detected in the setting of a previous DVT secondary to mechanical disruption. We addressed the hypercoaguable state of pregnancy and recommendations for prophylactic anticoagulation during pregnancy secondary to her potential risk for recurrence. We performed heparin teaching today and I reviewed risks of anticoagulation during pregnancy.   She is not taking NSAIDS.  We also addressed recommendations for pregnancy surveillance (  outlined below)  2. Bipolar disorder--she needs to resume medications.  We addressed the safety profile of her previous regimen (neurontin, thorazine, lithium).  She had the opportunity to meet with our genetic counselor regarding risks of teratology.  Please see that letter for details. I would recommend fetal echocardiogram  at 20-[redacted] weeks gestation due to Lithium expsoure and the association with congenital heart defects.  She is planning to meet a new psychiatrist in the coming weeks and wants to resume medications after meeting with her new provider.  We also addressed her increased risk for pp depression and need for  close follow up/support in the ppartum period  3. Tobacco use--we addressed risks for growth restriction and  placental abruption in the setting of continued tobacco use.  We provided materials for tobacco cessation support through the [facility].  She will need to call for the appointment.    Recommendations 1. [facility] would be [facility].  I would like to know the degree of residual stricture (if present).  We have requested medical release form to obtain records.  She will also attempt to obtain records stored at her father's home in [place].   2.  Lovenox 30mg  daily until 28 weeks.  Initiate 40 mg daily at 28 weeks and convert to standard Heparin (7500u bid) at [redacted] weeks gestation. I have given her prescriptions for Lovenox 30 mg daily and will have her follow up next week for complete blood count and platelet assessment, PT/PTT and Factor Xa.   3.  During labor she should have sequential compresion stockings in place.  They should remain in place until Lovenox is resumed.  4. Post partum she can resume Lovenox 12 hours after a vaginal delivery or 24 hours after cesarean section (in the event no contraindication such as pospartum hemorrhage) 5.  I would recommend continuation of anticoagulation for 6 weeks postpartum.  6.  Thromophilia screening with Factor V leiden, Prothrombin G, Protein C/S , Antithrombin III and Anticardiolipin antibody screening (obtained today). We addressed the possiblity of therapeutic dosing if thrombophilia testing demonstrates high risk condition.  7. I recommend pregnancy surveillance with detailed anatomic survey, fetal echocardiogram  (20-[redacted] weeks gestation--due to lithium exposure and father of baby with ?congenital heart defect), and serial growth assessments as outlined below 8.  We will see her in follow up in 2 weeks.  She will try to obtain her records for review. 9.  I advised her it is ok to have dental work performed during pregnancy. We reviewed safe medications (antibiotics, local anesthetics) and procedures (dental films).   Plan:   Genetic Counseling yes    Prenatal Diagnosis Options Level II US, Fetal echocardiogram at 20-[redacted] weeks gestation    Ultrasound at what gestational ages Monthly > 28 weeks    Antepartum Testing Weekly, Starting at 4134 to 36 weeks    Additional Testing Thrombophilia    Delivery at what gestational age 31-40 weeks     Total Time Spent with Patient 30 minutes    >50% of visit spent in couseling/coordination of care yes    Office Use Only 99242  Level 2 (30min) NEW office consult exp prob focused   Coding Description: MATERNAL CONDITIONS/HISTORY INDICATION(S).   OTHER: history of thoracic outlet syndrome, bipolar disorder.  Electronic Signatures: Consuelo PandySmall, Savon Bordonaro (MD)  (Signed 14-Jan-13 15:10)  Authored: Referral, Home Medications, Allergies, Consult, Impression, Plan, Billing, Coding Description   Last Updated: 14-Jan-13 15:10 by Consuelo PandySmall, Haygen Zebrowski (MD)

## 2014-11-16 NOTE — Consult Note (Signed)
Referral Information:   Reason for Referral 29 yo G3P0020 LMP 05/20/2011 and EDD 02/24/2012 at 28 weeks 6 days (by LMP c/w ultrasound performed on 08/05/11. 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 She had removal of first ribs bilaterally([facility]) and portions of her scalene muscles.  Thrombolyisis was attempted but unsuccessful due to hardening of plaque.  She had normal thrombophilia testing at last visit.  She also reports use of Plavix for approximately 8 months prior to pregnancy.  She reports residual numbness intermittently and occasional loss of sensation in right hand but overall has good function in that arm. She has a h/o bipolar disorder and has restarted her lithium and neurontin and she is using seroquel.The neurontin also helps her arm pain She requests info on breastfeeding.    Referring Physician [facility]    Prenatal Hx She has continued to try to cut back on her smoking.Her mood has improved . She has increased her lovenox to 40 but is using only once daily- I discussed with Dr Dolphus Jenny - she had started the patient on qd lovenox due to her small stature.    Past Obstetrical Hx 2005 pregnancy loss (8weeks) 2012 pregnancy loss (12-13 weeks), D&E--was on lovenox daily during that pregnancy   Home Medications:  Medication Instructions Status  Tylenol as needed  Active  lithium 300 mg oral capsule 1 cap(s) orally 3 times a day Active  Neurontin 800 mg oral tablet 1 tab(s) orally 3 times a day Active  Prenatal Multivitamins oral tablet 1 tab(s) orally once a day Active  Seroquel XR 300 mg oral tablet, extended release 1 tab(s) orally once a day (in the evening) Active  Lovenox 40 milligram(s) injectable once a day Active   Allergies:   Penicillin: Anaphylaxis  Ceftin: Anaphylaxis  Biaxin: Anaphylaxis  Iodine: Anaphylaxis  Shellfish: Anaphylaxis  Vital Signs/Notes:   Nursing Vital Signs:  **Vital Signs.:   16-May-13 09:26   Vital Signs Type Routine   Pulse Pulse 119   Pulse source per Dinamap   Respirations Respirations 12   Systolic BP Systolic BP 103   Diastolic BP (mmHg) Diastolic BP (mmHg) 67   Mean BP 79   BP Source Dinamap   Pulse Ox % Pulse Ox % 98   Perinatal Consult:   PMed Hx Rubella Immune    Past Medical History cont'd 1. Bipolar Disorder--she is currently NOT taking clonzaepam (never took during pregnancy and does not like side effects of this med) She has restarted  Litium, Neurontin, or and is using seroquel in place of thorazine  2. Dental Caries--she had dental care-    PSurg Hx laparoscopic ovarian cystectomy x 3 (2000, 2005, 2007),  bilateral first rib resection due to thoracic outlet syndrome (2001)    FHx birth defects on either side, Her mother had bipolar and committed suicide    Occupation Mother unemployed    Occupation Father [employer]    Soc Hx tobacco, decreased from 1ppd to 3 cig/daily   Review Of Systems:   Subjective feeling better    Tolerating Diet Nauseated    Medications/Allergies Reviewed Medications/Allergies reviewed     Blood Glucose:  16-May-13 10:47    POCT Blood Glucose 115  Routine Hem:  16-May-13 10:55    WBC (CBC) 19.9   RBC (CBC) 3.77   Hemoglobin (CBC) 11.4   Hematocrit (CBC) 34.9   Platelet  Count (CBC) 324   MCV 93   MCH 30.4   MCHC 32.8   RDW 14.6   Neutrophil % 78.5   Lymphocyte % 11.7   Monocyte % 8.3   Eosinophil % 1.2   Basophil % 0.3   Neutrophil # 15.6   Lymphocyte # 2.3   Monocyte # 1.7   Eosinophil # 0.2   Basophil # 0.1  Routine Chem:  16-May-13 10:55    Hemoglobin A1c ([facility]) 5.4     Impression/Recommendations:   Impression 29 yo G3P0020 at 28 weeks 6 days with: 1 previous 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   Her Thrombophilia assessment was negative. We  checked a CBC (Plt 324K)and Antifactor Xa level today- .She is injecting lovenox without difficulty.I discussed increasign to twice daily - due to small stature she was started on once daily.   2. Bipolar disorder--she resumed lithium , neurontin and seroquel. Her mood has improved . I advised har that many pediatricians advise against nursing on Lithium. Peds shoudl be notified to evaluate the neonate . Reports suggest that lithium may increase the risk of thyroid dysfunction - check TFTs later in third trimester.  3. Tobacco use--we discussed further reduction and avoiding second hand smoke from the father. 4. new diagnosis of GDM - FBS 124 RBS here 119 . Pt is scheduled to meet with dietitian and has a glucometer . HGBA1c 5.4 - to f/u with [facility] 5 Incidental leukocytosis WBC 19.9 Neutrophils 15.6 - I left a message for the pt    Recommendations 1. Increase  to   Lovenox 40 mg bid  at 28 weeks and convert to standard Heparin (10000u bid) at [redacted] weeks gestation. Given her small size we may need to drop her dose. Check a repeat CBC and antifactor Xa level in 1-2 weeks . During labor she should have sequential compresion stockings in place.  They should remain in place until Lovenox is resumed.  Post partum she can resume Lovenox 12 hours after a vaginal delivery  or 24 hours after cesarean section (in the event no contraindication such as pospartum hemorrhage). I would recommend continuation of anticoagulation for 6 weeks postpartum. The appropriate dose is challenging due to her size but 30mg  qd seems reasonable postpartum.   2. I recommend pregnancy surveillance with follow up growth ultrasounds q month , weekly NST testing at 36 weeks, induction at 39 weeks   Plan:     Ultrasound at what gestational ages Monthly > 28 weeks    Antepartum Testing Weekly, Starting at 6434 to 36 weeks     Delivery at what gestational age 31-40 weeks    Comment/Plan REpeat CBC , LMWH level     Total Time Spent  with Patient 15 minutes    >50% of visit spent in couseling/coordination of care yes    Office Use Only [id number]  Office Visit Level 3 (15min) EST exp prob focused outpt   Coding Description: MATERNAL CONDITIONS/HISTORY INDICATION(S).   OTHER: history of thoracic outlet syndrome, bipolar disorder.  Electronic Signatures: Rondall AllegraLivingston, Nyeem Stoke [place]am (MD)  (Signed 16-May-13 12:25)  Authored: Referral, Home Medications, Allergies, Vital Signs/Notes, Consult, Exam, Lab, Lab/Radiology Notes, Impression, Plan, Billing, Coding Description   Last Updated: 16-May-13 12:25 by Rondall AllegraLivingston, Shanise Balch [place]am (MD)

## 2014-11-16 NOTE — Consult Note (Signed)
Referral Information:   Reason for Referral 29 yo G3P0020 LMP 05/20/2011 and EDD 02/24/2012 at 13 weeks 6 days by LMP c/w ultrasound performed on 08/05/11.  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 She had removal of first ribs bilaterally([facility]) and portions of her scalene muscles.  Thrombolyisis was attempted but unsuccessful due to hardening of placque.  She had normal thrombophilia testing at last visit.  She also reports use of Plavix for approximately 8 months.  She reports residual numbness intermittently and occasional loss of sensation in right hand but overall has good function in that arm. She has a h/o bipolar disorder. She reports recent episodes of pre-syncope.    Referring Physician [facility]    Prenatal Hx Patient reports her arm discomfort is much improved after restarting her neurontin. She also feels like her mood is better after re-starting her lithium. She will restart her phenothiazine after she stops using her phenergan. She has been nervous due to h/o prior losses.  She has continued to try to cut back on her smoking.    Past Obstetrical Hx 2005 pregnancy loss (8weeks) 2012 pregnancy loss (12-13 weeks), D&E--was on lovenox daily during that pregnancy   Home Medications:  Tylenol as needed:   , Active  Lovenox: 30 milligram(s) injectable once a day, Active  Flintstones Complete oral tablet, chewable: 1 tab(s) orally once a day, Active  Phenergan:  orally , As Needed- for Nausea, Vomiting , Active  Allergies:   Penicillin: Anaphylaxis  Ceftin: Anaphylaxis  Biaxin: Anaphylaxis  Iodine: Anaphylaxis  Shellfish: Anaphylaxis  Vital Signs/Notes:  Nursing Vital Signs:  **Vital Signs.:   31-Jan-13 11:24   Vital Signs Type Routine   Temperature Temperature (F) 98.1   Celsius 36.7   Temperature Source oral   Pulse Pulse 96   Pulse source per Dinamap   Respirations  Respirations 12   Systolic BP Systolic BP 176   Diastolic BP (mmHg) Diastolic BP (mmHg) 67   Mean BP 81   BP Source Dinamap   Perinatal Consult:   PMed Hx Rubella Immune    Past Medical History cont'd 1. Bipolar Disorder--she is currently NOT taking clonzaepam (never took during pregnancy and does not like side effects of this med) She has not taken Litium, Neurontin, or Throazine (stopped these medications at [redacted] weeks gestation due to concern that they would harm pregnancy) She reports worsening depressive symptoms but has not suicidal ideation 2. Dental Caries--she is pursuing dental care-    PSurg Hx laparoscopic ovarian cystectomy x 3 (2000, 2005, 2007),  bilateral first rib resection due to thoracic outlet syndrome (2001)    FHx birth defects on either side, Her mother had bipolar and committed suicide    Occupation Mother unemployed    Occupation Father [employer]    Soc Hx tobacco, decreased from 1ppd to 3 cig/daily   Review Of Systems:   Subjective pre syncope - 3 episodes - felt hot and dizzy resolved after putting her head down - last one in line at [place] Nauseated     Medications/Allergies Reviewed Medications/Allergies reviewed      Routine Hem:  31-Jan-13 12:28    WBC (CBC) 14.5   RBC (CBC) 4.32   Hemoglobin (CBC) 13.4   Hematocrit (CBC) 39.4   Platelet Count (CBC) 247   MCV 91   MCH 31.0   MCHC  34.0   RDW 12.5   Neutrophil % 77.2   Lymphocyte % 13.1   Monocyte % 6.5   Eosinophil % 3.0   Basophil % 0.2   Neutrophil # 11.2   Lymphocyte # 1.9   Monocyte # 0.9   Eosinophil # 0.4   Basophil # 0.0  Routine Chem:  31-Jan-13 12:28    Glucose, Serum 79   BUN 6   Creatinine (comp) 0.60   Sodium, Serum 138   Potassium, Serum 3.9   Chloride, Serum 99   CO2, Serum 29   Calcium (Total), Serum 9.0   Anion Gap 10   Osmolality (calc) 272   eGFR (African American) >60   eGFR (Non-African American) >60  Hepatic:  31-Jan-13  12:28    Bilirubin, Total 0.2   Bilirubin, Direct < 0.1   Alkaline Phosphatase 52   SGPT (ALT) 38   SGOT (AST) 31   Total Protein, Serum 7.6   Albumin, Serum 3.5     Additional Lab/Radiology Notes positive doptone for FHR   Impression/Recommendations:   Impression 29 yo G3P0020 at 13 weeks 6 days with previous 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   Her Thrombophilia assessment was negative. We checked a CBC and Antifactor Xa level today Hct 39% and plt 247K . Her old records are not available.She is injecting lovenox without difficulty.  2. Bipolar disorder--she resumed lithium and will restart thorazine soon. Her mood has improved .  gestation due to Lithium expsoure and the association with congenital heart defects.     3. Tobacco use--we discussed further reduction and avoidign secodn hand smoke from the father.    Recommendations 1.  continue Lovenox 25m daily until 28 weeks.  Initiate 40 mg daily at 28 weeks and convert to standard Heparin (7500u bid) at [redacted] weeks gestation. During labor she should have sequential compresion stockings in place.  They should remain in place until Lovenox is resumed.  Post partum she can resume Lovenox 12 hours after a vaginal delivery or 24 hours after cesarean section (in the event no contraindication such as pospartum hemorrhage). I would recommend continuation of anticoagulation for 6 weeks postpartum.   2. I recommend pregnancy surveillance with detailed anatomic survey, fetal echocardiogram (20-[redacted] weeks gestation--due to lithium exposure and father of baby with ?congenital heart defect), and serial growth assessments as outlined below  3.  I reiterated  her it is ok to have dental work performed during pregnancy. We reviewed safe medications (antibiotics, local anesthetics) and procedures (dental films). She should notify her dentist of her lovenox use - if an  invasive procedure  is needed she can hold /delay her am dose.   Plan:   Genetic Counseling yes    Prenatal Diagnosis Options Level II [place] Fetal echocardiogram at 20-[redacted] weeks gestation    Ultrasound at what gestational ages Monthly > 28 weeks    Antepartum Testing Weekly, Starting at 357to 36 weeks    Additional Testing Thrombophilia    Delivery at what gestational age 113-40 weeks    Total Time Spent with Patient 15 minutes    >50% of visit spent in couseling/coordination of care yes    Office Use Only 99213  Office Visit Level 3 (172m) EST exp prob focused outpt   Coding Description: MATERNAL CONDITIONS/HISTORY INDICATION(S).   OTHER: history of thoracic outlet syndrome, bipolar disorder.  Electronic Signatures: LiSharyn CreamerMD)  (  Signed 31-Jan-13 14:35)  Authored: Referral, Home Medications, Allergies, Vital Signs/Notes, Consult, Exam, Lab, Lab/Radiology Notes, Impression, Plan, Billing, Coding Description   Last Updated: 31-Jan-13 14:35 by Sharyn Creamer (MD)

## 2014-12-02 NOTE — H&P (Signed)
L&D Evaluation:  History:   HPI 29 year old G3 P0020 with EDC=02/24/2012 by LMP=05/20/2011 and a 11 week ultrasound presents at 35 3/7 weeks with c/o nausea and vomiting since 01/21/12 PM when she left [facility] after a one day stay for insulin regulation/headache/backache.  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 She was insrtucted to take 10 U Humulin-N in the AM and 24 U at nite and to take 4u Humulin-R with breakfast and supper.  She took the 24 U Humulin N last night when her FSBS was 220, but held the other doses since she was vomiting. She c/o abdominal pain and back pain, contractions, and scant dark urine. She denies diarrhea and fever. She has vomited 10 times this morning alone. Has tried to drink clear liquids, but could not hold them down. Las ate solid food 2 days ago. Has had an ongoing problem with nausea and heartburn and has been continuing to need Zofran and Phenergan- LD was yesterday-but unable to keep down. Has also tried peptoBismol and Tums. Decreased FM since Saturday. Denies VB, LOF, fever. +/- dysuria. 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 She is also bipolar and has been on Lithium 300 mgm tid (10-2-8), Neurontin 800 mgm tid (10-2-8), and Serroquel 300 mgm qhs.  (unable to keep down since 6/29).    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 Humulin N 10 U in AM and 24 U in PM, Humulin R 4 U  with breakfast and supper.    Allergies Ceftin, Biaxin, PCN,  shellfish    Social History tobacco  Has quit smoking in the last few weeks (1 PPD at start of pregnancy)    Family History see prenatal   ROS:   ROS see HPI   Exam:   Vital Signs 144/82, 131/76     Urine Protein 1+    General vomiting on arrival    Mental Status clear     Chest clear     Heart normal sinus rhythm, no murmur/gallop/rubs    Abdomen tender upper abdomen, soft. palpated some mild contractions when vomiting stopped,    Fetal Position breech    Pelvic 1/50%/OOP    Mebranes Intact    FHT normal rate with no decels, reactive pattern    Fetal Heart Rate 140     Ucx irregular initially, now q3 min    Skin dry    Other glucose this AM=127, BUN and cr, plt, LFTs all WNL   Impression:   Impression IUP at 35 3/7 weeks with nausea/vomiting/dehydration.   GDM on insulin.  Breech presentation. Contractions probably due to dehydration.   Plan:   Plan EFM/NST, monitor contractions and for cervical change, monitor BP, IV hydration with NS. IV zofran/Protonix. Consult [facility].   Electronic Signatures: Trinna BalloonGutierrez, Alani Sabbagh L (CNM)  (Signed 01-Jul-13 08:42)  Authored: L&D Evaluation   Last Updated: 01-Jul-13 08:42 by Trinna BalloonGutierrez, Auden Tatar L (CNM)

## 2014-12-02 NOTE — H&P (Signed)
L&D Evaluation:  History:   HPI 29 year old G3 P0020 with EDC=02/24/2012 by LMP=05/20/2011 and a 11 week ultrasound presents with c/o lightheadedness and weakness and passing out twice while sitting in the car today. She has had loose stools for the past three days, and today has had 4 loose stools that appeared black in color. She also states she has not been able to make it to the [place] on 2-3 occasions, and soiled herself. She has vomited twice today, but was able to keep down an [employer] sandwich PTA  (FSBS=120 )and has drunk 4 bottles of water today. Has had an ongoing problem with nausea and now heartburn and has been continuing to need Zofran 3-4x/day. LD was 10AM. Has also used Phenergan for heartburn sx. Patient states that twice, while a passenger in the car today had LOC x several seconds preceeded by nausea and tunneling vision. c/o thoracic-lumbar pain bilaterally, pain in left upper quadrant, and midline upper abdominal "ripping pain", pelvic pressure and cramping in lower abdomen. Also c/o swelling in legs yesterday and arm and leg and neck swelling today.Baby active. Denies VB, LOF, and dysuria. Her prenatal care has also been complicated by Factor 2 deficiency/hx of DVT and currently is on Lovenox 40 mgm BID (dose increased from QD about 3 weeks ago). She is also bipolar and has been on Lithium 300 mgm tid (10-2-8), Neurontin 800 mgm tid (10-2-8), and Serroquel 300 mgm qhs. Patient has also been diagnosed with GDM and has been on 5 mgm glyburide in AM and 2.5 mgm qhs. Her blood sugars have not been well controlled and she was advised to increase her AM dose to 7.5 and the PM dose to 54mm.    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, GDM    Patient's Surgical History Laprascopic cystectomies x3 (2000, 2005, 2007), bilateral first rib resection for thoracic outlet syndrome, D&E    Medications Pre Natal Vitamins  lithium 300  mgm tid, Neurontin 800 mgm tid, Serroquel 300 qhs, Lovenox 40 mgm BID, Tums 2 tabs daily, glyburide 586m qAM and 2.5 mgm qhs, Zofran prn, Phenergan  prn    Allergies Ceftin, Biaxin, PCN, shellfish    Social History tobacco  Has quit smoking in the last few weeks (1 PPD at start of pregnancy)    Family History see prenatal   ROS:   ROS see HPI   Exam:   Vital Signs stable    Urine Protein UA: +1 protein, sp gravity 1.003, 19 RBC/HPF, 3+ leuks    General gravid female in NAD, good historian    Mental Status clear    Chest clear    Heart normal sinus rhythm, no murmur/gallop/rubs    Abdomen gravid, generalized tenderness, BS active    Fetal Position cephalic on [place]  Back +/- CVAT bilaterally/ muscle spasm of paraspinal muscles    Edema trace edema lower extremities    Reflexes +2 brachial, +1 popliteal but symmetrical    Pelvic no external lesions, cervix closed and thick, Baby OOP    Mebranes Intact    FHT normal rate with no decels, reactive NST    Ucx mild uterine irritability only    Skin dry    Other CN III-XII intact   Impression:   Impression IUP at 31 4/7 weeks with multiple complaints. and risk factors. LOC possibly due to vasovagal reaction. R/O blood in stool. R/O lithium toxicity.  Microscopic hematuria: R/O UTI. R/O urolithiasis   Plan:   Plan CBC, Antifactor  Xa, Met C, Lithium level, TSH. IV hydration with  NS. Zofran for nausea. Protonix for  reflux. urine culture. Consider kidney ultrasound . Stool for guiac. clear liquids.   Electronic Signatures: Karene Fry (CNM)  (Signed 07-Jun-13 18:46)  Authored: L&D Evaluation   Last Updated: 07-Jun-13 18:46 by Karene Fry (CNM)

## 2014-12-02 NOTE — H&P (Signed)
L&D Evaluation:  History Expanded:   HPI 29 yo G3P0020 with EDC of 02/24/12 and current gestational age of 8071w2d.  Pregnancy complicated by history of upper extrimity DVT at young age. She is followed by [facility] for this and for a cervix measuring 30mm on recent cervical length u/s.  She also has taken lithium this pregnancy and had a fetal echo around 22 weeks.  She has had a negative thrombophilia workup for her DVT, but she is taking lovenox currently.   She presents today with c/o feeling fever-like (did not have thermometer at home) and low abdominal pain today. Vomiting x 1 today. Also c/o some leaking/discharge. She notes good fetal movement & no vaginal bleeding. No chest pain or difficulty breathing.    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    Medications lithium, neurontin, lovenox    Allergies PCN, biaxin, shelfish (all sorts of shellfish)    Social History tobacco    Family History Non-Contributory   ROS:   ROS negative unless noted in HPI   Exam:   Vital Signs stable  T99, 98    General no apparent distress    Mental Status clear    Chest clear    Heart normal sinus rhythm    Abdomen mildly tender to palpation in left and right LQs    Back no CVAT    Edema no edema    Pelvic no external lesions, cervix closed and thick, wet mount negative for yeast, BV or Trich    Mebranes Intact, Fern & nitrizine negative     FHT normal rate and pattern given gestational age    Ucx absent    Skin dry    Other UA: trace ketones, 1+ leukocytes   Impression:   Impression abdominal pain   Plan:   Comments Possible short-term viral illness given low-grade temperature on arrival (and possible temperature at home.) Encouraged to rest and stay hydrated. F/u at next appt on 4/8    Follow Up Appointment already scheduled. Monday 4/8   Electronic Signatures: Vella KohlerBrothers, Lynnae Ludemann K (CNM)  (Signed 06-Apr-13 19:20)  Authored: L&D Evaluation   Last Updated: 06-Apr-13 19:20 by Vella KohlerBrothers, Aileana Hodder K (CNM)

## 2014-12-02 NOTE — H&P (Signed)
L&D Evaluation:  History:   HPI 29 yo G3P0020 with EDC of 02/24/12 and current gestational age of 6352w6d.  Pregnancy complicated by history of upper extrimity DVT at young age. She is followed by [facility] for this and for a cervix measuring 29mm on recent cervical length u/s.  She also has taken lithium this pregnancy and has a fetal echo scheduled by [facility] for around 22 weeks.  She has had a negative thrombophilia workup for her DVT, but she is taking lovenox currently.  She presents for abdominal pain with onset 3 days ago, but worsened last night at 9pm.  She notes good fetal movement, no leakage of fluid, no vaginal bleeding.  She describes the pain as a pressure in her abdomen, "like I have to pee." She notes nausea and vomiting since Monday night. She had a urinalysis on Monday that was suggestive of cystitis.  She was treated empirically, however, her urine culture returned as negative. She notes no chest pain or difficulty breathing.  She has had chills since last night, but has no subjective or objective fever.  Denies sick contacts.    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    Medications lithium, neurontin, lovenox    Allergies PCN, biaxin, shelfish (all sorts of shellfish)    Social History tobacco    Family History Non-Contributory   ROS:   ROS negative unless noted in HPI   Exam:   Vital Signs stable  afebril T=37C    General no apparent distress    Mental Status clear    Chest clear    Heart normal sinus rhythm    Abdomen mildly tender to palpation in left and right flanks, no rebound or guarding.    Back no CVAT    Edema no edema    Pelvic no external lesions, cervix closed and  thick    FHT normal rate and pattern given gestational age    Other Wet Mount= negative KOH = negative   Impression:   Impression abdominal pain   Plan:   Plan UA    Comments - Send cervical cultures - Send urine culture, but low likelihood given recent negative culture - she likely has gastroenteritis.  However, she was given warning signs of potential pregnancy issues. - no obstetrical etiology identified at this time - will discharge with follow up in 4 days    Follow Up Appointment already scheduled. Monday 4/1   Electronic Signatures: Conard NovakJackson, Kammi Hechler D (MD)  (Signed 28-Mar-13 18:18)  Authored: L&D Evaluation   Last Updated: 28-Mar-13 18:18 by Conard NovakJackson, Otniel Hoe D (MD)
